# Patient Record
Sex: Male | Born: 1965
Health system: Southern US, Community
[De-identification: ages and names within clinical notes are randomized; demographics above are authoritative.]

## PROBLEM LIST (undated history)

## (undated) DIAGNOSIS — K219 Gastro-esophageal reflux disease without esophagitis: Secondary | ICD-10-CM

## (undated) DIAGNOSIS — R112 Nausea with vomiting, unspecified: Secondary | ICD-10-CM

## (undated) DIAGNOSIS — Z9889 Other specified postprocedural states: Secondary | ICD-10-CM

## (undated) HISTORY — DX: Gastro-esophageal reflux disease without esophagitis: K21.9

---

## 2005-03-12 HISTORY — PX: HAND TENDON SURGERY: SHX663

## 2005-06-25 ENCOUNTER — Ambulatory Visit: Payer: Self-pay | Admitting: Cardiology

## 2005-06-26 ENCOUNTER — Ambulatory Visit: Payer: Self-pay | Admitting: Cardiology

## 2005-07-10 ENCOUNTER — Ambulatory Visit: Payer: Self-pay | Admitting: Cardiology

## 2005-09-27 ENCOUNTER — Ambulatory Visit (HOSPITAL_COMMUNITY): Admission: RE | Admit: 2005-09-27 | Discharge: 2005-09-28 | Payer: Self-pay | Admitting: Orthopedic Surgery

## 2013-02-18 ENCOUNTER — Encounter: Payer: Self-pay | Admitting: Family Medicine

## 2013-02-18 ENCOUNTER — Encounter (INDEPENDENT_AMBULATORY_CARE_PROVIDER_SITE_OTHER): Payer: Self-pay

## 2013-02-18 ENCOUNTER — Ambulatory Visit (INDEPENDENT_AMBULATORY_CARE_PROVIDER_SITE_OTHER): Payer: 59 | Admitting: Family Medicine

## 2013-02-18 VITALS — BP 142/92 | HR 77 | Temp 98.3°F | Ht 67.5 in | Wt 191.6 lb

## 2013-02-18 DIAGNOSIS — Z23 Encounter for immunization: Secondary | ICD-10-CM

## 2013-02-18 DIAGNOSIS — Z0000 Encounter for general adult medical examination without abnormal findings: Secondary | ICD-10-CM

## 2013-02-18 LAB — POCT CBC
Granulocyte percent: 65.1 %G (ref 37–80)
HCT, POC: 51.1 % (ref 43.5–53.7)
Hemoglobin: 16.6 g/dL (ref 14.1–18.1)
Lymph, poc: 3 (ref 0.6–3.4)
MCH, POC: 28.3 pg (ref 27–31.2)
MCHC: 32.5 g/dL (ref 31.8–35.4)
MCV: 87.1 fL (ref 80–97)
MPV: 8.9 fL (ref 0–99.8)
POC Granulocyte: 6.1 (ref 2–6.9)
POC LYMPH PERCENT: 31.5 %L (ref 10–50)
Platelet Count, POC: 328 10*3/uL (ref 142–424)
RBC: 5.9 M/uL (ref 4.69–6.13)
RDW, POC: 13.2 %
WBC: 9.4 10*3/uL (ref 4.6–10.2)

## 2013-02-18 NOTE — Progress Notes (Signed)
   Subjective:    Patient ID: Joseph Merritt, male    DOB: 09-Jun-1965, 47 y.o.   MRN: [id number]  HPI  This 47 y.o. male presents for evaluation of CPE.  He has no acute problems.  Review of Systems No chest pain, SOB, HA, dizziness, vision change, N/V, diarrhea, constipation, dysuria, urinary urgency or frequency, myalgias, arthralgias or rash.     Objective:   Physical Exam Vital signs noted  Well developed well nourished male.  HEENT - Head atraumatic Normocephalic                Eyes - PERRLA, Conjuctiva - clear Sclera- Clear EOMI                Ears - EAC's Wnl TM's Wnl Gross Hearing WNL                Nose - Nares patent                 Throat - oropharanx wnl Respiratory - Lungs CTA bilateral Cardiac - RRR S1 and S2 without murmur GI - Abdomen soft Nontender and bowel sounds active x 4 Extremities - No edema. Neuro - Grossly intact.       Assessment & Plan:  Routine general medical examination at a health care facility - Plan: POCT CBC, CMP14+EGFR, Lipid panel, PSA, total and free, Vit D  25 hydroxy (rtn osteoporosis monitoring)  Need for prophylactic vaccination and inoculation against influenza  Deatra Canter FNP

## 2013-02-19 LAB — VITAMIN D 25 HYDROXY (VIT D DEFICIENCY, FRACTURES): Vit D, 25-Hydroxy: 21.7 ng/mL — ABNORMAL LOW (ref 30.0–100.0)

## 2013-02-19 LAB — LIPID PANEL
Chol/HDL Ratio: 4 ratio units (ref 0.0–5.0)
Cholesterol, Total: 174 mg/dL (ref 100–199)
HDL: 43 mg/dL (ref >39)
LDL Calculated: 113 mg/dL — ABNORMAL HIGH (ref 0–99)
Triglycerides: 89 mg/dL (ref 0–149)
VLDL Cholesterol Cal: 18 mg/dL (ref 5–40)

## 2013-02-19 LAB — PSA, TOTAL AND FREE
PSA, Free Pct: 31.4 %
PSA, Free: 0.22 ng/mL
PSA: 0.7 ng/mL (ref 0.0–4.0)

## 2013-02-19 LAB — CMP14+EGFR
ALT: 35 IU/L (ref 0–44)
AST: 21 IU/L (ref 0–40)
Albumin/Globulin Ratio: 1.8 (ref 1.1–2.5)
Albumin: 4.6 g/dL (ref 3.5–5.5)
Alkaline Phosphatase: 93 IU/L (ref 39–117)
BUN/Creatinine Ratio: 14 (ref 9–20)
BUN: 14 mg/dL (ref 6–24)
CO2: 24 mmol/L (ref 18–29)
Calcium: 9.8 mg/dL (ref 8.7–10.2)
Chloride: 101 mmol/L (ref 97–108)
Creatinine, Ser: 1.01 mg/dL (ref 0.76–1.27)
GFR calc Af Amer: 103 mL/min/{1.73_m2} (ref >59)
GFR calc non Af Amer: 89 mL/min/{1.73_m2} (ref >59)
Globulin, Total: 2.6 g/dL (ref 1.5–4.5)
Glucose: 96 mg/dL (ref 65–99)
Potassium: 5.2 mmol/L (ref 3.5–5.2)
Sodium: 142 mmol/L (ref 134–144)
Total Bilirubin: 0.3 mg/dL (ref 0.0–1.2)
Total Protein: 7.2 g/dL (ref 6.0–8.5)

## 2014-04-12 ENCOUNTER — Encounter: Payer: 59 | Admitting: Family

## 2014-11-11 ENCOUNTER — Ambulatory Visit (INDEPENDENT_AMBULATORY_CARE_PROVIDER_SITE_OTHER): Payer: BLUE CROSS/BLUE SHIELD | Admitting: Family

## 2014-11-11 ENCOUNTER — Encounter: Payer: Self-pay | Admitting: Family

## 2014-11-11 VITALS — BP 136/96 | HR 72 | Temp 98.2°F | Ht 67.5 in | Wt 195.8 lb

## 2014-11-11 DIAGNOSIS — S86812A Strain of other muscle(s) and tendon(s) at lower leg level, left leg, initial encounter: Secondary | ICD-10-CM

## 2014-11-11 DIAGNOSIS — S86912A Strain of unspecified muscle(s) and tendon(s) at lower leg level, left leg, initial encounter: Secondary | ICD-10-CM

## 2014-11-11 DIAGNOSIS — M25562 Pain in left knee: Secondary | ICD-10-CM

## 2014-11-11 MED ORDER — METHYLPREDNISOLONE ACETATE 80 MG/ML IJ SUSP
80.0000 mg | Freq: Once | INTRAMUSCULAR | Status: AC
  Administered 2014-11-11: 80 mg via INTRAMUSCULAR

## 2014-11-11 MED ORDER — KETOROLAC TROMETHAMINE 60 MG/2ML IM SOLN
60.0000 mg | Freq: Once | INTRAMUSCULAR | Status: AC
  Administered 2014-11-11: 60 mg via INTRAMUSCULAR

## 2014-11-11 MED ORDER — MELOXICAM 15 MG PO TABS: 15.0000 mg | ORAL_TABLET | Freq: Every day | ORAL | Status: DC

## 2014-11-11 NOTE — Patient Instructions (Signed)
Lateral Collateral Knee Ligament Sprain with Phase II Rehab The lateral collateral ligament (LCL) of the knee helps hold the knee joint in proper alignment and prevents the bones from shifting out of alignment (displacing) toward the outside (laterally). Injury to the knee may cause a tear in the LCL ligament (sprain). The LCL is the least common ligament of the knee to be injured. Sprains may heal on their own, but they often result in a loose joint. Sprains are classified into three categories. Grade 1 sprains cause pain, but the tendon is not lengthened. Grade 2 sprains include a lengthened ligament, due to the ligament being stretched or partially ruptured. With grade 2 sprains there is still function, although the function may be decreased. Grade 3 sprains involve a complete tear of the tendon or muscle, and function is usually impaired. SYMPTOMS   Pain and tenderness on the outer side of the knee.  A "pop", tearing, or pulling sensation at the time of injury.  Bruising (contusion) at the site of injury within 48 hours of injury.  Knee stiffness.  Limping, often walking with the knee bent. CAUSES  An LCL sprain occurs when a force is placed on the ligament that is greater than it can handle. Common causes of injury include:  Direct hit (trauma) to the inner side of the knee, especially if the foot is planted on the ground.  Forceful pivoting of the body and leg, while the foot is planted on the ground. RISK INCREASES WITH:  Contact sports (football, rugby).  Sports that require pivoting or cutting (soccer).  Poor knee strength and flexibility.  Improper equipment use. PREVENTION   Warm up and stretch properly before activity.  Maintain physical fitness:  Strength, flexibility, and endurance.  Cardiovascular fitness.  Wear properly fitted protective equipment (correct length of cleats for surface).  Functional braces may be effective in preventing injury. PROGNOSIS  If  treated properly, LCL tears usually heal on their own. Sometimes, surgery is required. RELATED COMPLICATIONS   Frequently recurring symptoms, such as knee giving way, instability, and swelling.  Injury to other structures in the knee joint.  Meniscal cartilage, resulting in locking and swelling of the knee.  Articular cartilage, resulting in knee arthritis.  Other ligaments of the knee (commonly).  Injury to nerves, causing numbness of the outer leg, foot, and ankle and weakness or paralysis, with inability to raise the ankle, big toe, or lesser toes.  Knee stiffness (loss of knee motion). TREATMENT Treatment first involves the use of ice and medicine, to reduce pain and inflammation. The use of strengthening and stretching exercises may help reduce pain with activity. These exercises may be performed at home, but referral to a therapist is often advised. You may be advised to walk with crutches, until you are able to walk without a limp. Your caregiver may provide you with a hinged knee brace to help regain a full range of motion, while also protecting the injured knee. For severe LCL injuries, or injuries that involve other ligaments of the knee, surgery is often advised. MEDICATION   If pain medicine is needed, nonsteroidal anti-inflammatory medicines (aspirin and ibuprofen), or other minor pain relievers (acetaminophen), are often advised.  Do not take pain medicine for 7 days before surgery.  Prescription pain relievers may be given, if your caregiver thinks they are needed. Use only as directed and only as much as you need. HEAT AND COLD  Cold treatment (icing) should be applied for 10 to 15 minutes every  2 to 3 hours for inflammation and pain, and immediately after activity that aggravates your symptoms. Use ice packs or an ice massage.  Heat treatment may be used before performing stretching and strengthening activities prescribed by your caregiver, physical therapist, or  athletic trainer. Use a heat pack or a warm water soak. SEEK MEDICAL CARE IF:   Symptoms get worse or do not improve in 4 to 6 weeks, despite treatment.  New, unexplained symptoms develop. (Drugs used in treatment may produce side effects.) EXERCISES  PHASE II EXERCISES  RANGE OF MOTION (ROM) AND STRETCHING EXERCISES - Lateral Collateral Knee Ligament Sprain Phase II After your physician, physical therapist or athletic trainer feels your knee has made progress significant enough to begin more advanced exercises, he or she may recommend some of the exercises that follow. He or she may also advise you to continue with the exercises which you completed in Phase I of your rehabilitation. While completing these exercises, remember:   These exercises are intended to help you restore motion without increasing any swelling.  Restoring tissue flexibility helps normal motion to return to the joints. This allows healthier, less painful movement and activity.  An effective stretch should be held for at least 30 seconds.  A stretch should never be painful. You should only feel a gentle lengthening or release in the stretched tissue. STRETCH - Quadriceps, Prone  Lie on your stomach on a firm surface, such as a bed or padded floor.  Bend your right / left knee and grasp your ankle. If you are unable to reach your ankle or pant leg, use a belt around your foot to lengthen your reach.  Gently pull your heel toward your buttocks. Your knee should not slide out to the side. You should feel a stretch in the front of your thigh and knee.  Hold this position for __________ seconds. Repeat __________ times. Complete this stretch __________ times per day.  STRETCH - Knee Extension, Prone  Lie on your stomach on a firm surface, such as a bed or countertop. Place your right / left knee and leg just beyond the edge of the surface. You may wish to place a towel under the far end of your right / left thigh for  comfort.  Relax your leg muscles and allow gravity to straighten your knee. Your caregiver may advise you to add an ankle weight, if more resistance is helpful for you.  You should feel a stretch in the back of your right / left knee. Hold this position for __________ seconds. Repeat __________ times. Complete this __________ times per day. * Your physician, physical therapist or athletic trainer may ask you to use a __________ ankle weight to enhance your stretch. STRENGTHENING EXERCISES - Lateral Collateral Knee Ligament Sprain Phase II These are some of the exercises you may progress to in your rehabilitation program. Do not begin these until you have your caregiver's permission. Although your condition has improved, the Phase I exercises will continue to be helpful and you may continue to complete them. As you complete strengthening exercises, remember:   Strong muscles with good endurance tolerate stress better.  Do the exercises as initially prescribed by your caregiver. Progress slowly with each exercise, gradually increasing the number of repetitions and weight used under his or her guidance.  You may experience muscle soreness or fatigue, but the pain or discomfort you are trying to eliminate should never worsen during these exercises. If this pain does get worse, stop and  make certain you are following the directions exactly. If the pain is still present after adjustments, discontinue the exercise until you can discuss the trouble with your caregiver. STRENGTH - Hamstring, Curls   Lay on your stomach with your legs extended. (If you lay on a bed, your feet may hang over the edge.)  Tighten the muscles in the back of your thigh to bend your right / left knee up to 90 degrees. Keep your hips flat on the bed.  Hold this position for __________ seconds.  Slowly lower your leg back to the starting position. Repeat __________ times. Complete this exercise __________ times per day.    OPTIONAL ANKLE WEIGHTS: Begin with ____________________, but DO NOT exceed ____________________. Increase in 1 pound/0.5 kilogram increments. STRENGTH - Quadriceps, Short Arcs  Lie on your back. Place a __________ inch towel roll under your right / left knee, so that the knee bends slightly.  Raise only your lower leg by tightening the muscles in the front of your thigh. Do not allow your thigh to rise.  Hold this position for __________ seconds. Repeat __________ times. Complete this exercise __________ times per day.  OPTIONAL ANKLE WEIGHTS: Begin with ____________________, but DO NOT exceed ____________________. Increase in 1 pound/0.5 kilogram increments. STRENGTH - Quadriceps, Step-Ups   Use a thick book, step or step stool that is __________ inches tall.  Hold a wall or counter for balance only, not support.  Slowly step up with your right / left foot, keeping your knee in line with your hip and foot. Do not allow your knee to bend so far that you cannot see your toes.  Slowly unlock your knee and lower yourself to the starting position. Your muscles, not gravity, should lower you. Repeat __________ times. Complete this exercise __________ times per day.  STRENGTH - Quadriceps, Wall Slides  Follow guidelines for form closely. Increased knee pain often results from poorly placed feet or knees.  Lean against a smooth wall or door and walk your feet out 18-24 inches. Place your feet hip width apart.  Slowly slide down the wall or door until your knees bend __________ degrees.* Keep your knees over your heels, not your toes, and in line with your hips, not falling to either side.  Hold for __________ seconds. Stand up to rest for __________ seconds in between each repetition. Repeat __________ times. Complete this exercise __________ times per day. * Your physician, physical therapist or athletic trainer will alter this angle based on your symptoms and progress. Document Released:  06/19/2005 Document Revised: 05/21/2011 Document Reviewed: 06/10/2008 [facility] Patient Information 2015 [place], [place]. This information is not intended to replace advice given to you by your health care provider. Make sure you discuss any questions you have with your health care provider.

## 2014-11-11 NOTE — Addendum Note (Signed)
Addended by: Prescott Gum on: 11/11/2014 04:59 PM   Modules accepted: Kipp Brood

## 2014-11-11 NOTE — Progress Notes (Signed)
Subjective:    Patient ID: Joseph Merritt, male    DOB: [birth year], 49 y.o.   MRN: [id number]  Pt presents to the office today for recurrent left knee pain. Pt states he had similar pain a 5 to 6 months ago, but went away by it's self. Pt states about 3 weeks ago the pain started again and has gradually become worse. PT states he has not taken any medication for it. Knee Pain  The incident occurred more than 1 week ago. There was no injury mechanism. The pain is present in the left knee. The quality of the pain is described as aching. The pain is at a severity of 8/10. The pain is moderate. The pain has been intermittent since onset. Pertinent negatives include no inability to bear weight, numbness or tingling. He reports no foreign bodies present. The symptoms are aggravated by weight bearing. He has tried nothing for the symptoms. The treatment provided no relief.      Review of Systems  Constitutional: Negative.   HENT: Negative.   Respiratory: Negative.   Cardiovascular: Negative.   Gastrointestinal: Negative.   Endocrine: Negative.   Genitourinary: Negative.   Musculoskeletal: Negative.   Neurological: Negative.  Negative for tingling and numbness.  Hematological: Negative.   Psychiatric/Behavioral: Negative.   All other systems reviewed and are negative.      Objective:   Physical Exam  Constitutional: He is oriented to person, place, and time. He appears well-developed and well-nourished. No distress.  HENT:  Head: Normocephalic.  Right Ear: External ear normal.  Left Ear: External ear normal.  Nose: Nose normal.  Mouth/Throat: Oropharynx is clear and moist.  Eyes: Pupils are equal, round, and reactive to light. Right eye exhibits no discharge. Left eye exhibits no discharge.  Neck: Normal range of motion. Neck supple. No thyromegaly present.  Cardiovascular: Normal rate, regular rhythm, normal heart sounds and intact distal pulses.   No murmur heard. Pulmonary/Chest:  Effort normal and breath sounds normal. No respiratory distress. He has no wheezes.  Abdominal: Soft. Bowel sounds are normal. He exhibits no distension. There is no tenderness.  Musculoskeletal: Normal range of motion. He exhibits no edema or tenderness.  Full ROM of left knee, pt states his pain is lateral left calf when moving knee  Neurological: He is alert and oriented to person, place, and time. He has normal reflexes. No cranial nerve deficit.  Skin: Skin is warm and dry. No rash noted. No erythema.  Psychiatric: He has a normal mood and affect. His behavior is normal. Judgment and thought content normal.  Vitals reviewed.   BP 136/96 mmHg  Pulse 72  Temp(Src) 98.2 F (36.8 C) (Oral)  Ht 5' 7.5" (1.715 m)  Wt 195 lb 12.8 oz (88.814 kg)  BMI 30.20 kg/m2       Assessment & Plan:  1. Left knee pain - ketorolac (TORADOL) injection 60 mg; Inject 2 mLs (60 mg total) into the muscle once. - methylPREDNISolone acetate (DEPO-MEDROL) injection 80 mg; Inject 1 mL (80 mg total) into the muscle once. - CMP14+EGFR - Arthritis Panel  2. Knee strain, left, initial encounter -Rest -Ice -Rest -No other NSAID's while taking Mobic -Discussed exercises to help knee -RTO prn- If pain continues may need MRI or Ortho referral - ketorolac (TORADOL) injection 60 mg; Inject 2 mLs (60 mg total) into the muscle once. - methylPREDNISolone acetate (DEPO-MEDROL) injection 80 mg; Inject 1 mL (80 mg total) into the muscle once. - CMP14+EGFR - Arthritis  Panel - meloxicam (MOBIC) 15 MG tablet; Take 1 tablet (15 mg total) by mouth daily.  Dispense: 30 tablet; Refill: 0  Evelina Dun, FNP

## 2014-11-12 LAB — ARTHRITIS PANEL
BASOS ABS: 0 10*3/uL (ref 0.0–0.2)
BASOS: 0 %
EOS (ABSOLUTE): 0.5 10*3/uL — ABNORMAL HIGH (ref 0.0–0.4)
EOS: 5 %
Hematocrit: 47 % (ref 37.5–51.0)
Hemoglobin: 15.4 g/dL (ref 12.6–17.7)
IMMATURE GRANS (ABS): 0 10*3/uL (ref 0.0–0.1)
IMMATURE GRANULOCYTES: 0 %
LYMPHS: 33 %
Lymphocytes Absolute: 3 10*3/uL (ref 0.7–3.1)
MCH: 28.4 pg (ref 26.6–33.0)
MCHC: 32.8 g/dL (ref 31.5–35.7)
MCV: 87 fL (ref 79–97)
MONOCYTES: 7 %
MONOS ABS: 0.6 10*3/uL (ref 0.1–0.9)
NEUTROS PCT: 55 %
Neutrophils Absolute: 4.8 10*3/uL (ref 1.4–7.0)
PLATELETS: 318 10*3/uL (ref 150–379)
RBC: 5.42 x10E6/uL (ref 4.14–5.80)
RDW: 13.7 % (ref 12.3–15.4)
Rhuematoid fact SerPl-aCnc: <10 IU/mL (ref 0.0–13.9)
SED RATE: 2 mm/h (ref 0–15)
Uric Acid: 5.9 mg/dL (ref 3.7–8.6)
WBC: 9 10*3/uL (ref 3.4–10.8)

## 2014-11-12 LAB — CMP14+EGFR
A/G RATIO: 1.6 (ref 1.1–2.5)
ALT: 17 IU/L (ref 0–44)
AST: 20 IU/L (ref 0–40)
Albumin: 4.4 g/dL (ref 3.5–5.5)
Alkaline Phosphatase: 94 IU/L (ref 39–117)
BUN / CREAT RATIO: 13 (ref 9–20)
BUN: 13 mg/dL (ref 6–24)
Bilirubin Total: <0.2 mg/dL (ref 0.0–1.2)
CALCIUM: 9.8 mg/dL (ref 8.7–10.2)
CHLORIDE: 102 mmol/L (ref 97–108)
CO2: 24 mmol/L (ref 18–29)
Creatinine, Ser: 0.99 mg/dL (ref 0.76–1.27)
GFR, EST AFRICAN AMERICAN: 104 mL/min/{1.73_m2} (ref >59)
GFR, EST NON AFRICAN AMERICAN: 90 mL/min/{1.73_m2} (ref >59)
GLUCOSE: 85 mg/dL (ref 65–99)
Globulin, Total: 2.8 g/dL (ref 1.5–4.5)
Potassium: 5.2 mmol/L (ref 3.5–5.2)
Sodium: 143 mmol/L (ref 134–144)
TOTAL PROTEIN: 7.2 g/dL (ref 6.0–8.5)

## 2014-12-08 ENCOUNTER — Ambulatory Visit: Payer: BLUE CROSS/BLUE SHIELD | Admitting: Pediatrics

## 2014-12-09 ENCOUNTER — Ambulatory Visit (INDEPENDENT_AMBULATORY_CARE_PROVIDER_SITE_OTHER): Payer: BLUE CROSS/BLUE SHIELD | Admitting: Pediatrics

## 2014-12-09 ENCOUNTER — Ambulatory Visit (INDEPENDENT_AMBULATORY_CARE_PROVIDER_SITE_OTHER): Payer: BLUE CROSS/BLUE SHIELD

## 2014-12-09 ENCOUNTER — Encounter: Payer: Self-pay | Admitting: Pediatrics

## 2014-12-09 VITALS — BP 141/97 | HR 76 | Temp 97.5°F | Ht 67.5 in | Wt 195.4 lb

## 2014-12-09 DIAGNOSIS — S86812A Strain of other muscle(s) and tendon(s) at lower leg level, left leg, initial encounter: Secondary | ICD-10-CM

## 2014-12-09 DIAGNOSIS — M25562 Pain in left knee: Secondary | ICD-10-CM | POA: Diagnosis not present

## 2014-12-09 DIAGNOSIS — R030 Elevated blood-pressure reading, without diagnosis of hypertension: Secondary | ICD-10-CM

## 2014-12-09 DIAGNOSIS — S86912A Strain of unspecified muscle(s) and tendon(s) at lower leg level, left leg, initial encounter: Secondary | ICD-10-CM

## 2014-12-09 DIAGNOSIS — IMO0001 Reserved for inherently not codable concepts without codable children: Secondary | ICD-10-CM

## 2014-12-09 MED ORDER — MELOXICAM 15 MG PO TABS: 15.0000 mg | ORAL_TABLET | Freq: Every day | ORAL | Status: DC

## 2014-12-09 NOTE — Progress Notes (Signed)
Subjective:    Patient ID: Joseph Merritt, male    DOB: 1965-10-24, 49 y.o.   MRN: [id number]  HPI: Joseph Merritt is a 49 y.o. male presenting on 12/09/2014 for Knee Pain  Seen 4 weeks ago for knee pain, had an IM steroid injection, improved the pain for 1 week. Feels the knee pain when sitting not doing anything, sometimes pain goes up his thigh. Walking is when pain is the worst.  Hurts with full extension and about 90 degrees of flexion starts feeling tight over quad.  No injuries. No regular exercise now. Now working at [employer], walks a lot, uneven surfaces.  Pain is better in the morning, once he starts walking it hurts more. Taking mobic most days, always at night, doesn't notice a difference when he takes it vs not taking it.  No redness. Can point to where it hurts the most, medial knee above joint line.  Relevant past medical, surgical, family and social history reviewed and updated as indicated. Interim medical history since our last visit reviewed. Allergies and medications reviewed and updated.  ROS: Per HPI unless specifically indicated above  Past Medical History There are no active problems to display for this patient.   Current Outpatient Prescriptions  Medication Sig Dispense Refill  . meloxicam (MOBIC) 15 MG tablet Take 1 tablet (15 mg total) by mouth daily. 30 tablet 2   No current facility-administered medications for this visit.       Objective:    BP 141/97 mmHg  Pulse 76  Temp(Src) 97.5 F (36.4 C) (Oral)  Ht 5' 7.5" (1.715 m)  Wt 195 lb 6.4 oz (88.633 kg)  BMI 30.13 kg/m2  Wt Readings from Last 3 Encounters:  12/09/14 195 lb 6.4 oz (88.633 kg)  11/11/14 195 lb 12.8 oz (88.814 kg)  02/18/13 191 lb 9.6 oz (86.909 kg)    Gen: NAD, alert, cooperative with exam, NCAT EYES: EOMI, no scleral injection or icterus CV: WWP, distal pulses intact Resp:  normal WOB Neuro: Alert and oriented, sensation intact LE b/l MSK:  Knee exam: L  knee slightly swollen compared to R, no redness or heat, small amount of effusion, no obvious bony abnormalities. TTP medial femoral epicondyle and some along medial joint line.  No patellar tenderness. ROM full in flexion and extension and lower leg rotation on R, on L pain with last 10 degrees of extension, and pain limits flexion to 90 degrees.. Ligaments with solid consistent endpoints including ACL, PCL, LCL, MCL. Negative Mcmurray's Non painful patellar compression. Patellar glide without crepitus. Patellar and quadriceps tendons unremarkable. Hamstring and quadriceps strength is normal and equal R and L.  Xray: joint space normal, no OA, no fractures. I personally reviewed this image.    Assessment & Plan:   Joseph Merritt was seen today for knee pain, pt has a small effusion, Xray with no OA, exam with intact ligaments. Pt going to take meloxicam in the morning, knee brace the last two days has helped some, ice, rest. RTC if not improved, can refer for PT vs sports medicine.   Diagnoses and all orders for this visit:  Knee pain, acute, left -     DG Knee 1-2 Views Left; Future  Knee strain, left, initial encounter -     meloxicam (MOBIC) 15 MG tablet; Take 1 tablet (15 mg total) by mouth daily.  Elevated blood pressure Pt to recheck at home and bring numbers back to clinic, has f/u appt already  scheduled for CPE.   Follow up plan: As scheduled, sooner if needed  Rex Kras, MD [facility] 12/09/2014, 2:38 PM

## 2014-12-27 ENCOUNTER — Encounter (INDEPENDENT_AMBULATORY_CARE_PROVIDER_SITE_OTHER): Payer: Self-pay

## 2014-12-27 ENCOUNTER — Ambulatory Visit (INDEPENDENT_AMBULATORY_CARE_PROVIDER_SITE_OTHER): Payer: BLUE CROSS/BLUE SHIELD | Admitting: Family Medicine

## 2014-12-27 ENCOUNTER — Encounter: Payer: Self-pay | Admitting: Family Medicine

## 2014-12-27 VITALS — BP 154/105 | HR 68 | Temp 97.9°F | Ht 67.5 in | Wt 194.4 lb

## 2014-12-27 DIAGNOSIS — Z0000 Encounter for general adult medical examination without abnormal findings: Secondary | ICD-10-CM

## 2014-12-27 DIAGNOSIS — I10 Essential (primary) hypertension: Secondary | ICD-10-CM | POA: Insufficient documentation

## 2014-12-27 DIAGNOSIS — Z1211 Encounter for screening for malignant neoplasm of colon: Secondary | ICD-10-CM | POA: Insufficient documentation

## 2014-12-27 DIAGNOSIS — G8929 Other chronic pain: Secondary | ICD-10-CM | POA: Insufficient documentation

## 2014-12-27 DIAGNOSIS — Z23 Encounter for immunization: Secondary | ICD-10-CM

## 2014-12-27 DIAGNOSIS — M25562 Pain in left knee: Secondary | ICD-10-CM | POA: Insufficient documentation

## 2014-12-27 MED ORDER — HYDROCHLOROTHIAZIDE 12.5 MG PO CAPS: 12.5000 mg | ORAL_CAPSULE | Freq: Every day | ORAL | Status: DC

## 2014-12-27 NOTE — Progress Notes (Signed)
   HPI  Patient presents today for an annual physical.  Left knee pain. He states he's had one and a half months of left knee pain described as medial dull aching knee pain worse after a long day's work. He does have locking and popping symptoms He denies any injury He has some mild swelling and increased pain after work. No fevers, chills, sweats.  Elevated blood pressure States that his blood pressures been high or borderline high for his lungs he can remember. He is okay with starting medications today No chest pain, dyspnea, palpitations, leg edema.  No regular exercise regimen, however he does work at [employer] and does very physical work  PMH: Smoking status noted Past medical, surgical, social, and family history reviewed and updated in EMR ROS: Per HPI  Objective: BP 154/105 mmHg  Pulse 68  Temp(Src) 97.9 F (36.6 C) (Oral)  Ht 5' 7.5" (1.715 m)  Wt 194 lb 6.4 oz (88.179 kg)  BMI 29.98 kg/m2 Gen: NAD, alert, cooperative with exam HEENT: NCAT, sclera white CV: RRR, good S1/S2, no murmur Resp: CTABL, no wheezes, non-labored Ext: No edema, warm Neuro: Alert and oriented, No gross deficits MSK: L knee without effusion Slight medial joint line tenderness ligamentously intact to Lachman's and with varus and valgus stress.  Negative McMurray's test   Assessment and plan:  # annual physical Fasting labs Flu shot  # HTN New Dx Start 12.5 mg HCTZ Last 4 visits with diastolic >90  # L knee pain Likely meniscal injury Discussed continued conservative therapy, steroid injections and possibility of joint aspiration for crystal analysis, also discussed possibility of benefit of knee arthroscopy/referral to orthopedics with walking popping symptoms.   Meds ordered this encounter  Medications  . hydrochlorothiazide (MICROZIDE) 12.5 MG capsule    Sig: Take 1 capsule (12.5 mg total) by mouth daily.    Dispense:  30 capsule    Refill:  2    Murtis SinkSam Bradshaw,  MD [facility] 12/27/2014, 10:17 AM

## 2014-12-27 NOTE — Patient Instructions (Signed)
Great to meet you!  For your knee Wear compression sleeve while working or exercising Ice after work Mobic is ok, Armond Hangaleve is ok as well but do not mix them.   We have started HCTZ for your blood pressure, please come back in 6-8 weeks for repeat labs and to re-check your blood pressure.

## 2014-12-28 LAB — CMP14+EGFR
A/G RATIO: 1.8 (ref 1.1–2.5)
ALT: 24 IU/L (ref 0–44)
AST: 27 IU/L (ref 0–40)
Albumin: 4.5 g/dL (ref 3.5–5.5)
Alkaline Phosphatase: 94 IU/L (ref 39–117)
BILIRUBIN TOTAL: 0.4 mg/dL (ref 0.0–1.2)
BUN/Creatinine Ratio: 16 (ref 9–20)
BUN: 14 mg/dL (ref 6–24)
CHLORIDE: 101 mmol/L (ref 97–106)
CO2: 26 mmol/L (ref 18–29)
Calcium: 9.8 mg/dL (ref 8.7–10.2)
Creatinine, Ser: 0.86 mg/dL (ref 0.76–1.27)
GFR calc Af Amer: 119 mL/min/{1.73_m2} (ref >59)
GFR calc non Af Amer: 103 mL/min/{1.73_m2} (ref >59)
GLUCOSE: 82 mg/dL (ref 65–99)
Globulin, Total: 2.5 g/dL (ref 1.5–4.5)
POTASSIUM: 5.5 mmol/L — AB (ref 3.5–5.2)
Sodium: 140 mmol/L (ref 136–144)
TOTAL PROTEIN: 7 g/dL (ref 6.0–8.5)

## 2014-12-28 LAB — LIPID PANEL
CHOL/HDL RATIO: 3.2 ratio (ref 0.0–5.0)
CHOLESTEROL TOTAL: 139 mg/dL (ref 100–199)
HDL: 43 mg/dL (ref >39)
LDL CALC: 79 mg/dL (ref 0–99)
TRIGLYCERIDES: 85 mg/dL (ref 0–149)
VLDL CHOLESTEROL CAL: 17 mg/dL (ref 5–40)

## 2014-12-28 LAB — CBC
HEMATOCRIT: 46.9 % (ref 37.5–51.0)
HEMOGLOBIN: 15.7 g/dL (ref 12.6–17.7)
MCH: 28.6 pg (ref 26.6–33.0)
MCHC: 33.5 g/dL (ref 31.5–35.7)
MCV: 85 fL (ref 79–97)
Platelets: 343 10*3/uL (ref 150–379)
RBC: 5.49 x10E6/uL (ref 4.14–5.80)
RDW: 14.2 % (ref 12.3–15.4)
WBC: 9.3 10*3/uL (ref 3.4–10.8)

## 2015-01-28 ENCOUNTER — Encounter: Payer: Self-pay | Admitting: Family Medicine

## 2015-01-28 ENCOUNTER — Ambulatory Visit (INDEPENDENT_AMBULATORY_CARE_PROVIDER_SITE_OTHER): Payer: BLUE CROSS/BLUE SHIELD | Admitting: Family Medicine

## 2015-01-28 VITALS — BP 153/96 | HR 76 | Temp 97.2°F | Ht 67.5 in | Wt 195.4 lb

## 2015-01-28 DIAGNOSIS — I10 Essential (primary) hypertension: Secondary | ICD-10-CM

## 2015-01-28 DIAGNOSIS — E875 Hyperkalemia: Secondary | ICD-10-CM | POA: Insufficient documentation

## 2015-01-28 DIAGNOSIS — M25562 Pain in left knee: Secondary | ICD-10-CM | POA: Diagnosis not present

## 2015-01-28 DIAGNOSIS — K047 Periapical abscess without sinus: Secondary | ICD-10-CM | POA: Diagnosis not present

## 2015-01-28 MED ORDER — HYDROCHLOROTHIAZIDE 25 MG PO TABS: 25.0000 mg | ORAL_TABLET | Freq: Every day | ORAL | Status: DC

## 2015-01-28 NOTE — Progress Notes (Signed)
   HPI  Patient presents today here to follow-up for hypertension and left knee pain.  Hypertension Doing better, blood pressure at home ranging 120-140 over high 80s. He is noticed diuresis, but having no side effects from HCTZ. No chest pain, leg edema, dyspnea. He does have occasional palpitations with no other symptoms.  Left knee pain Improving, still occasionally has sharp pain while standing at work. Considering cortisone shot  Tooth abscess. Notes left upper tooth that has some creamy discharge now for over. He states that he is talking his dentist about and he states he needs to get pulled, he has been on antibodies for this. , However that was several months ago. He is asking if he should get it addressed by his dentist. He has no facial pain, swelling, or signs of illness at this time.  PMH: Smoking status noted ROS: Per HPI  Objective: BP 153/96 mmHg  Pulse 76  Temp(Src) 97.2 F (36.2 C) (Oral)  Ht 5' 7.5" (1.715 m)  Wt 195 lb 6.4 oz (88.633 kg)  BMI 30.13 kg/m2 Gen: NAD, alert, cooperative with exam HEENT: NCAT CV: RRR, good S1/S2, no murmur Resp: CTABL, no wheezes, non-labored Ext: No edema, warm Neuro: Alert and oriented, No gross deficits  Assessment and plan:  # Hypertension Improving, still not quite controlled Increase HCTZ 25 mg daily Previously had hyperkalemia, repeat BMP today   # Knee pain Discussed utility of knee injection today, with him improving I recommended watching and waiting. Still offered orthopedic referral if needed.  # Possible abscess tooth Recommended addressing with the dentist, he has no signs of active infection currently, he will call me if he has any symptoms.   Orders Placed This Encounter  Procedures  . BMP8+EGFR    Meds ordered this encounter  Medications  . hydrochlorothiazide (HYDRODIURIL) 25 MG tablet    Sig: Take 1 tablet (25 mg total) by mouth daily.    Dispense:  90 tablet    Refill:  [place], MD [place] 01/28/2015, 10:07 AM

## 2015-01-28 NOTE — Patient Instructions (Signed)
Great to see you!   I increased your medicine, just take 2 pills dialy until you get a refill.   Come back in 2 months

## 2015-01-29 LAB — BMP8+EGFR
BUN / CREAT RATIO: 13 (ref 9–20)
BUN: 12 mg/dL (ref 6–24)
CO2: 26 mmol/L (ref 18–29)
CREATININE: 0.89 mg/dL (ref 0.76–1.27)
Calcium: 9.4 mg/dL (ref 8.7–10.2)
Chloride: 99 mmol/L (ref 97–106)
GFR calc non Af Amer: 101 mL/min/{1.73_m2} (ref >59)
GFR, EST AFRICAN AMERICAN: 117 mL/min/{1.73_m2} (ref >59)
Glucose: 121 mg/dL — ABNORMAL HIGH (ref 65–99)
Potassium: 5.3 mmol/L — ABNORMAL HIGH (ref 3.5–5.2)
Sodium: 140 mmol/L (ref 136–144)

## 2015-01-31 NOTE — Progress Notes (Signed)
Patient aware.

## 2015-02-15 ENCOUNTER — Other Ambulatory Visit: Payer: Self-pay

## 2015-02-15 DIAGNOSIS — S86912A Strain of unspecified muscle(s) and tendon(s) at lower leg level, left leg, initial encounter: Secondary | ICD-10-CM

## 2015-02-15 MED ORDER — MELOXICAM 15 MG PO TABS: 15.0000 mg | ORAL_TABLET | Freq: Every day | ORAL | Status: DC

## 2015-03-31 ENCOUNTER — Ambulatory Visit (INDEPENDENT_AMBULATORY_CARE_PROVIDER_SITE_OTHER): Payer: BLUE CROSS/BLUE SHIELD | Admitting: Family Medicine

## 2015-03-31 ENCOUNTER — Encounter: Payer: Self-pay | Admitting: Family Medicine

## 2015-03-31 VITALS — BP 130/84 | HR 75 | Temp 97.9°F | Ht 67.5 in | Wt 194.0 lb

## 2015-03-31 DIAGNOSIS — L570 Actinic keratosis: Secondary | ICD-10-CM

## 2015-03-31 DIAGNOSIS — R0982 Postnasal drip: Secondary | ICD-10-CM | POA: Diagnosis not present

## 2015-03-31 DIAGNOSIS — I10 Essential (primary) hypertension: Secondary | ICD-10-CM | POA: Diagnosis not present

## 2015-03-31 DIAGNOSIS — E875 Hyperkalemia: Secondary | ICD-10-CM

## 2015-03-31 DIAGNOSIS — J029 Acute pharyngitis, unspecified: Secondary | ICD-10-CM | POA: Diagnosis not present

## 2015-03-31 LAB — POCT RAPID STREP A (OFFICE): RAPID STREP A SCREEN: NEGATIVE

## 2015-03-31 MED ORDER — MOMETASONE FUROATE 50 MCG/ACT NA SUSP: 2.0000 | Freq: Every day | NASAL | Status: DC

## 2015-03-31 NOTE — Patient Instructions (Signed)
Great to see you!  Come back in 4 months unless you need [place] sooner  We will call with labs within 1 week  Try nasonex for you sore throat and hoarseness (I think its post nasal drip, so this will dry up your nose)

## 2015-03-31 NOTE — Progress Notes (Signed)
HPI  Patient presents today today for hypertension recheck, skin lesion, and sore throat.  Sore throat For about 2 weeks, with   frequent throat clearing No cough, shortness of breath, or malaise.  Hypertension Good medication compliance No chest pain, dyspnea, palpitations, leg edema.  Skin lesion 6+ months of history of repetitive skin lesion on his right temporal area, he picks at it, it peels off, and then grows back. He would like examined today No history of skin cancer  PMH: Smoking status noted ROS: Per HPI  Objective: BP 130/84 mmHg  Pulse 75  Temp(Src) 97.9 F (36.6 C) (Oral)  Ht 5' 7.5" (1.715 m)  Wt 194 lb (87.998 kg)  BMI 29.92 kg/m2 Gen: NAD, alert, cooperative with exam HEENT: NCAT,  nares with mucosal swelling, oropharynx clear, TMs normal CV: RRR, good S1/S2, no murmur Resp: CTABL, no wheezes, non-labored Ext: No edema, warm Neuro: Alert and oriented, No gross deficits  Skin: Hyperkeratotic lesion approximately 1-2 mm in diameter on his right temporal area, no erythema or tenderness to palpation   Assessment and plan:  # Actinic keratosis Treated with cryotherapy today Discussed usual course of illness  # Hypertension Well-controlled Continue HCTZ  # Hyperkalemia Stable last check Recheck today  # post nasal drip Nasonex     Orders Placed This Encounter  Procedures  . Culture, Group A Strep    Order Specific Question:  Source    Answer:  throat  . BMP8+EGFR  . POCT rapid strep A    Meds ordered this encounter  Medications  . mometasone (NASONEX) 50 MCG/ACT nasal spray    Sig: Place 2 sprays into the nose daily.    Dispense:  17 g    Refill:  [place], MD Jansen 03/31/2015, 10:00 AM

## 2015-04-01 LAB — BMP8+EGFR
BUN/Creatinine Ratio: 15 (ref 9–20)
BUN: 14 mg/dL (ref 6–24)
CALCIUM: 9.4 mg/dL (ref 8.7–10.2)
CO2: 25 mmol/L (ref 18–29)
CREATININE: 0.91 mg/dL (ref 0.76–1.27)
Chloride: 98 mmol/L (ref 96–106)
GFR calc Af Amer: 115 mL/min/{1.73_m2} (ref >59)
GFR, EST NON AFRICAN AMERICAN: 99 mL/min/{1.73_m2} (ref >59)
GLUCOSE: 118 mg/dL — AB (ref 65–99)
Potassium: 4 mmol/L (ref 3.5–5.2)
SODIUM: 138 mmol/L (ref 134–144)

## 2015-04-03 LAB — CULTURE, GROUP A STREP: Strep A Culture: NEGATIVE

## 2015-04-19 ENCOUNTER — Other Ambulatory Visit: Payer: Self-pay

## 2015-04-19 MED ORDER — HYDROCHLOROTHIAZIDE 25 MG PO TABS: 25.0000 mg | ORAL_TABLET | Freq: Every day | ORAL | Status: DC

## 2015-04-21 ENCOUNTER — Encounter: Payer: Self-pay | Admitting: *Deleted

## 2015-07-29 ENCOUNTER — Ambulatory Visit: Payer: BLUE CROSS/BLUE SHIELD | Admitting: Family Medicine

## 2015-08-01 ENCOUNTER — Ambulatory Visit: Payer: BLUE CROSS/BLUE SHIELD | Admitting: Family Medicine

## 2015-08-03 ENCOUNTER — Encounter (INDEPENDENT_AMBULATORY_CARE_PROVIDER_SITE_OTHER): Payer: Self-pay

## 2015-08-04 ENCOUNTER — Encounter: Payer: Self-pay | Admitting: Family Medicine

## 2015-08-04 ENCOUNTER — Ambulatory Visit (INDEPENDENT_AMBULATORY_CARE_PROVIDER_SITE_OTHER): Payer: BLUE CROSS/BLUE SHIELD | Admitting: Family Medicine

## 2015-08-04 VITALS — BP 138/91 | HR 76 | Temp 97.4°F | Ht 67.5 in | Wt 192.4 lb

## 2015-08-04 DIAGNOSIS — I10 Essential (primary) hypertension: Secondary | ICD-10-CM | POA: Diagnosis not present

## 2015-08-04 DIAGNOSIS — E875 Hyperkalemia: Secondary | ICD-10-CM | POA: Diagnosis not present

## 2015-08-04 DIAGNOSIS — M25562 Pain in left knee: Secondary | ICD-10-CM

## 2015-08-04 NOTE — Progress Notes (Signed)
   HPI  Patient presents today here to follow-up for hypertension knee pain.  Hypertension Has several days a week where he is forgetting doses, maybe 3 or 4 days a week he forgets to take HCTZ He denies any headache, chest pain, shortness of breath. He checks blood pressure every once in a while at a local store, systolic is generally 120 or 130 over low 90s He does have noticeable diuresis with HCTZ.  Knee pain Described as annoying type knee pain Usually does not stop him. It has improved since the initial visit. He has no more improvement meloxicam and Tylenol, I recommended continuing Tylenol.  He also processed foods, however he does not add much salt to his foods  PMH: Smoking status noted ROS: Per HPI  Objective: BP 138/91 mmHg  Pulse 76  Temp(Src) 97.4 F (36.3 C) (Oral)  Ht 5' 7.5" (1.715 m)  Wt 192 lb 6.4 oz (87.272 kg)  BMI 29.67 kg/m2 Gen: NAD, alert, cooperative with exam HEENT: NCAT CV: RRR, good S1/S2, no murmur Resp: CTABL, no wheezes, non-labored Ext: No edema, warm Neuro: Alert and oriented, No gross deficits  Assessment and plan:  # Hypertension Borderline today and diastolic blood pressure Continue HCTZ, improved compliance, decreased salt intake Continue therapeutic lifestyle changes, discussed exercise especially pertaining to his knee pain.  # Knee pain Encouraged low-impact exercise to strengthen quadricep muscles like on a stationary bike Return to clinic with any worsening, otherwise follow up as needed   # Hyperkalemia Last visit was resolved, Plan on checking with next set of labs, 5-6 months is okay  Avoiding ACE inhibitor's for now    Murtis SinkSam Bradshaw, MD [facility] 08/04/2015, 3:06 PM

## 2015-10-03 IMAGING — CR DG KNEE 1-2V*L*
2 series · 2 of 2 positions shown · non-contrast
Comparison: None.

CLINICAL DATA: Acute left knee pain.

EXAM:
LEFT KNEE - 1-2 VIEW

[view not recorded (1 of 2)]
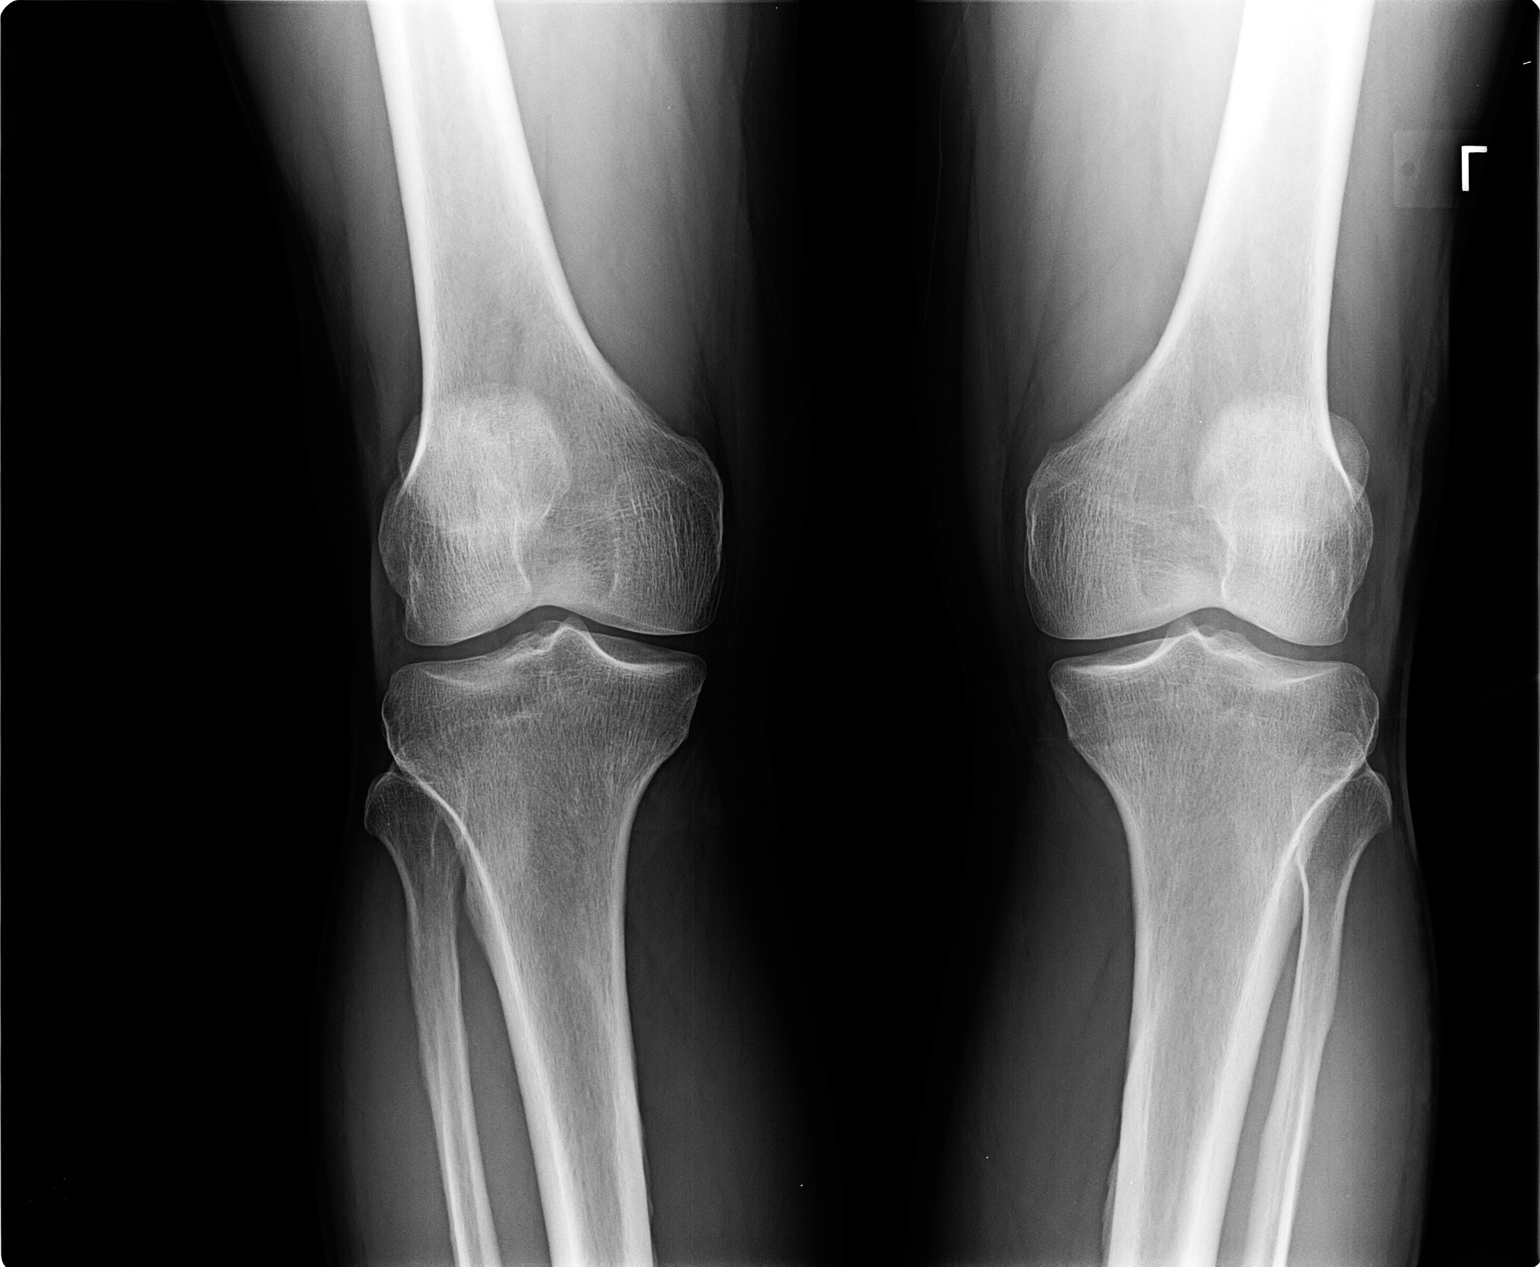

[view not recorded (2 of 2)]
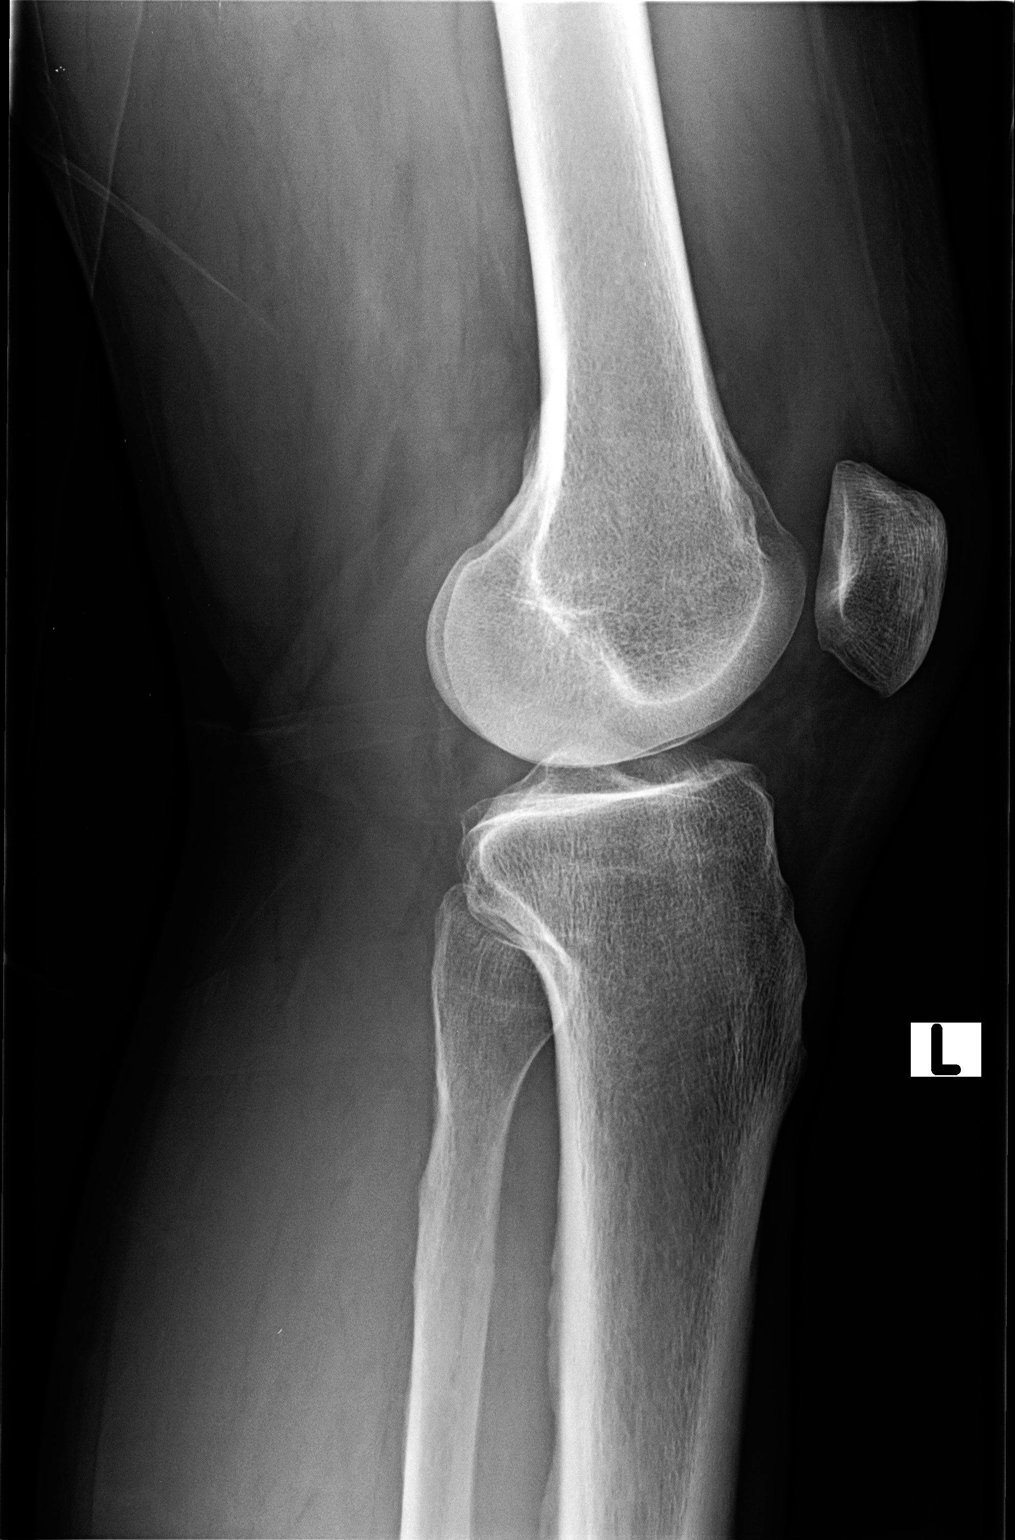

[2 of 2 positions shown; findings below may reference images not displayed]

FINDINGS: There is a joint effusion.  Osseous structures appear normal.
IMPRESSION: Joint effusion.

## 2016-02-16 ENCOUNTER — Other Ambulatory Visit: Payer: Self-pay | Admitting: Family Medicine

## 2016-05-27 ENCOUNTER — Other Ambulatory Visit: Payer: Self-pay | Admitting: Family Medicine

## 2016-06-11 ENCOUNTER — Ambulatory Visit (INDEPENDENT_AMBULATORY_CARE_PROVIDER_SITE_OTHER): Payer: BLUE CROSS/BLUE SHIELD | Admitting: Family Medicine

## 2016-06-11 ENCOUNTER — Encounter: Payer: Self-pay | Admitting: Family Medicine

## 2016-06-11 VITALS — BP 120/83 | HR 75 | Temp 98.3°F | Ht 67.5 in | Wt 196.8 lb

## 2016-06-11 DIAGNOSIS — Z0000 Encounter for general adult medical examination without abnormal findings: Secondary | ICD-10-CM

## 2016-06-11 DIAGNOSIS — J200 Acute bronchitis due to Mycoplasma pneumoniae: Secondary | ICD-10-CM

## 2016-06-11 LAB — BAYER DCA HB A1C WAIVED: HB A1C: 5.9 % (ref <7.0)

## 2016-06-11 MED ORDER — AZITHROMYCIN 250 MG PO TABS: ORAL_TABLET | ORAL | 0 refills | Status: DC

## 2016-06-11 NOTE — Progress Notes (Signed)
   HPI  Patient presents today for annual physical, also for cough.  Patient feels well.  He has no concerns except for the cough.  He is not exercising or watching his diet regularly.  He denies any shortness of breath,  chills, sweats. Reports 2 weeks of cough, productive off and on, off and on subjective fever. Patient states he got worse after going on a chair to [place] where he had lots of sightseeing. No medications for allergies. Has been taking Mucinex as well as DayQuil   PMH: Smoking status noted ROS: Per HPI  Objective: BP 120/83   Pulse 75   Temp 98.3 F (36.8 C) (Oral)   Ht 5' 7.5" (1.715 m)   Wt 196 lb 12.8 oz (89.3 kg)   BMI 30.37 kg/m  Gen: NAD, alert, cooperative with exam HEENT: NCAT, oropharynx clear, nares clear, TMs normal bilaterally, EOMI, PERRLA CV: RRR, good S1/S2, no murmur Resp: CTABL, no wheezes, non-labored Abd: SNTND, BS present, no guarding or organomegaly Ext: No edema, warm Neuro: Alert and oriented, 1+ symmetric PT reflexes, strength 5/5 and sensation intact in bilateral lower extremities  Assessment and plan:  # Annual physical exam Normal exam except for weight, discussed therapeutic lifestyle changes Basic labs including PSA and A1c. Patient's glucose was118 last year.  # Atypical pneumonia, acute bronchitis Treat as walking pneumonia with azithromycin, waxing and waning cough with off and on fever for 2 weeks. Continue over-the-counter cough medication Return to clinic if not improving as expected   Orders Placed This Encounter  Procedures  . CMP14+EGFR  . CBC with Differential/Platelet  . Lipid panel  . TSH  . PSA  . Bayer DCA Hb A1c Waived    Meds ordered this encounter  Medications  . azithromycin (ZITHROMAX) 250 MG tablet    Sig: Take 2 tablets on day 1 and 1 tablet daily after that    Dispense:  6 tablet    Refill:  0    Laroy Apple, MD [facility] 06/11/2016, 11:54 AM

## 2016-06-11 NOTE — Patient Instructions (Signed)
Great to see you!  Try the azithro for your cough, continue the cough medicine you are taking.   Start a plain zyrtec daily for 1-2 weeks as well.

## 2016-06-12 LAB — CBC WITH DIFFERENTIAL/PLATELET
BASOS ABS: 0 10*3/uL (ref 0.0–0.2)
BASOS: 0 %
EOS (ABSOLUTE): 0.5 10*3/uL — AB (ref 0.0–0.4)
Eos: 6 %
Hematocrit: 45.4 % (ref 37.5–51.0)
Hemoglobin: 15.9 g/dL (ref 13.0–17.7)
Immature Grans (Abs): 0 10*3/uL (ref 0.0–0.1)
Immature Granulocytes: 0 %
LYMPHS ABS: 2.7 10*3/uL (ref 0.7–3.1)
LYMPHS: 37 %
MCH: 28.5 pg (ref 26.6–33.0)
MCHC: 35 g/dL (ref 31.5–35.7)
MCV: 82 fL (ref 79–97)
MONOS ABS: 0.9 10*3/uL (ref 0.1–0.9)
Monocytes: 13 %
Neutrophils Absolute: 3.3 10*3/uL (ref 1.4–7.0)
Neutrophils: 44 %
PLATELETS: 314 10*3/uL (ref 150–379)
RBC: 5.57 x10E6/uL (ref 4.14–5.80)
RDW: 14.4 % (ref 12.3–15.4)
WBC: 7.4 10*3/uL (ref 3.4–10.8)

## 2016-06-12 LAB — PSA: Prostate Specific Ag, Serum: 0.5 ng/mL (ref 0.0–4.0)

## 2016-06-12 LAB — CMP14+EGFR
A/G RATIO: 1.4 (ref 1.2–2.2)
ALT: 19 IU/L (ref 0–44)
AST: 24 IU/L (ref 0–40)
Albumin: 4.3 g/dL (ref 3.5–5.5)
Alkaline Phosphatase: 101 IU/L (ref 39–117)
BILIRUBIN TOTAL: 0.2 mg/dL (ref 0.0–1.2)
BUN/Creatinine Ratio: 13 (ref 9–20)
BUN: 12 mg/dL (ref 6–24)
CHLORIDE: 100 mmol/L (ref 96–106)
CO2: 26 mmol/L (ref 18–29)
Calcium: 9.3 mg/dL (ref 8.7–10.2)
Creatinine, Ser: 0.94 mg/dL (ref 0.76–1.27)
GFR calc Af Amer: 110 mL/min/{1.73_m2} (ref >59)
GFR calc non Af Amer: 95 mL/min/{1.73_m2} (ref >59)
GLOBULIN, TOTAL: 3 g/dL (ref 1.5–4.5)
Glucose: 89 mg/dL (ref 65–99)
POTASSIUM: 5 mmol/L (ref 3.5–5.2)
SODIUM: 142 mmol/L (ref 134–144)
TOTAL PROTEIN: 7.3 g/dL (ref 6.0–8.5)

## 2016-06-12 LAB — TSH: TSH: 0.746 u[IU]/mL (ref 0.450–4.500)

## 2016-06-12 LAB — LIPID PANEL
CHOL/HDL RATIO: 4.5 ratio (ref 0.0–5.0)
Cholesterol, Total: 141 mg/dL (ref 100–199)
HDL: 31 mg/dL — ABNORMAL LOW (ref >39)
LDL CALC: 95 mg/dL (ref 0–99)
Triglycerides: 77 mg/dL (ref 0–149)
VLDL Cholesterol Cal: 15 mg/dL (ref 5–40)

## 2017-03-03 ENCOUNTER — Emergency Department (HOSPITAL_COMMUNITY)
Admission: EM | Admit: 2017-03-03 | Discharge: 2017-03-03 | Disposition: A | Discharge status: Home / Self Care | Payer: Worker's Compensation | Attending: Emergency Medicine | Admitting: Emergency Medicine

## 2017-03-03 ENCOUNTER — Emergency Department (HOSPITAL_COMMUNITY): Payer: Worker's Compensation

## 2017-03-03 ENCOUNTER — Encounter (HOSPITAL_COMMUNITY): Payer: Self-pay

## 2017-03-03 DIAGNOSIS — S59902A Unspecified injury of left elbow, initial encounter: Secondary | ICD-10-CM | POA: Diagnosis present

## 2017-03-03 DIAGNOSIS — S46219A Strain of muscle, fascia and tendon of other parts of biceps, unspecified arm, initial encounter: Secondary | ICD-10-CM

## 2017-03-03 DIAGNOSIS — S46222A Laceration of muscle, fascia and tendon of other parts of biceps, left arm, initial encounter: Secondary | ICD-10-CM | POA: Diagnosis not present

## 2017-03-03 DIAGNOSIS — Y929 Unspecified place or not applicable: Secondary | ICD-10-CM | POA: Diagnosis not present

## 2017-03-03 DIAGNOSIS — Y990 Civilian activity done for income or pay: Secondary | ICD-10-CM | POA: Diagnosis not present

## 2017-03-03 DIAGNOSIS — X58XXXA Exposure to other specified factors, initial encounter: Secondary | ICD-10-CM | POA: Insufficient documentation

## 2017-03-03 DIAGNOSIS — Y939 Activity, unspecified: Secondary | ICD-10-CM | POA: Insufficient documentation

## 2017-03-03 NOTE — ED Triage Notes (Signed)
Pt reports was working with a machine at work today and it caused him to abruptly extend his left arm.  C/O pain to left arm between elbow and bicep.

## 2017-03-03 NOTE — ED Provider Notes (Signed)
[facility] Provider Note   CSN: [id number] Arrival date & time: 03/03/17  1445     History   Chief Complaint Chief Complaint  Patient presents with  . Arm Pain    HPI Joseph Merritt is a 51 y.o. male with history of GERD who presents with left elbow pain after hyperextension injury at work.  Patient reports a wire got away from him and pulled his arm straight out.  He now has pain in his anterior elbow.  It is worse with movement.  He denies numbness or tingling.  He took Aleve around lunchtime before the incident happened for an unrelated knee pain.  He denies pain elsewhere including left shoulder and wrist.  HPI  Past Medical History:  Diagnosis Date  . GERD (gastroesophageal reflux disease)     Patient Active Problem List   Diagnosis Date Noted  . Hyperkalemia 01/28/2015  . Left medial knee pain 12/27/2014  . HTN (hypertension) 12/27/2014  . Annual physical exam 12/27/2014    Past Surgical History:  Procedure Laterality Date  . HAND TENDON SURGERY Right 2007   hand caught between copper at work       Home Medications    Prior to Admission medications   Medication Sig Start Date End Date Taking? Authorizing Provider  azithromycin (ZITHROMAX) 250 MG tablet Take 2 tablets on day 1 and 1 tablet daily after that 06/11/16   Elenora GammaBradshaw, Samuel L, MD  hydrochlorothiazide (HYDRODIURIL) 25 MG tablet TAKE 1 TABLET (25 MG TOTAL) BY MOUTH DAILY. 02/17/16   Elenora GammaBradshaw, Samuel L, MD    Family History Family History  Problem Relation Age of Onset  . Diabetes Mother   . Hypertension Mother   . Hypertension Father     Social History Social History   Tobacco Use  . Smoking status: Never Smoker  . Smokeless tobacco: Never Used  Substance Use Topics  . Alcohol use: No  . Drug use: No     Allergies   Patient has no known allergies.   Review of Systems Review of Systems  Musculoskeletal: Positive for arthralgias (L elbow). Negative for joint  swelling.  Neurological: Negative for weakness and numbness.     Physical Exam Updated Vital Signs BP (!) 146/97   Pulse 72   Temp 98.4 F (36.9 C) (Oral)   Resp 18   Ht 5\' 8"  (1.727 m)   Wt 89.8 kg (198 lb)   SpO2 97%   BMI 30.11 kg/m   Physical Exam  Constitutional: He appears well-developed and well-nourished. No distress.  HENT:  Head: Normocephalic and atraumatic.  Mouth/Throat: Oropharynx is clear and moist. No oropharyngeal exudate.  Eyes: Conjunctivae are normal. Pupils are equal, round, and reactive to light. Right eye exhibits no discharge. Left eye exhibits no discharge. No scleral icterus.  Neck: Normal range of motion. Neck supple. No thyromegaly present.  Cardiovascular: Normal rate, regular rhythm, normal heart sounds and intact distal pulses. Exam reveals no gallop and no friction rub.  No murmur heard. Pulmonary/Chest: Effort normal and breath sounds normal. No stridor. No respiratory distress. He has no wheezes. He has no rales.  Abdominal: Soft. Bowel sounds are normal. He exhibits no distension. There is no tenderness. There is no rebound and no guarding.  Musculoskeletal: He exhibits no edema.       Left elbow: He exhibits normal range of motion.  Tenderness over the biceps tendon, full range of motion of elbow and 5/5 strength with elbow flexion and  extension; pain with extension; equal bilateral grip strength; no edema or ecchymosis; biceps tendon not palpated symmetrically, unable to hook the tendon on the L side; pain increases with supination  Lymphadenopathy:    He has no cervical adenopathy.  Neurological: He is alert. Coordination normal.  Skin: Skin is warm and dry. No rash noted. He is not diaphoretic. No pallor.  Psychiatric: He has a normal mood and affect.  Nursing note and vitals reviewed.    ED Treatments / Results  Labs (all labs ordered are listed, but only abnormal results are displayed) Labs Reviewed - No data to display  EKG  EKG  Interpretation None       Radiology Dg Elbow Complete Left  Result Date: 03/03/2017 CLINICAL DATA:  Hyperextension injury with left elbow pain. EXAM: LEFT ELBOW - COMPLETE 3+ VIEW COMPARISON:  None. FINDINGS: There mild degenerate changes of the elbow joint. There is no definite acute fracture or dislocation. No displaced fat pads. Prominent posterior enthesophyte over the olecranon. IMPRESSION: No acute findings. Mild degenerative changes. Electronically Signed   By: Joseph Merritt  Boyle M.D.   On: 03/03/2017 15:47    Procedures Procedures (including critical care time)  Medications Ordered in ED Medications - No data to display   Initial Impression / Assessment and Plan / ED Course  I have reviewed the triage vital signs and the nursing notes.  Pertinent labs & imaging results that were available during my care of the patient were reviewed by me and considered in my medical decision making (see chart for details).     Patient was suspected, at least partial, biceps tendon avulsion or tear.  Range of motion, and neurovascularly intact.  X-ray shows no acute findings.  I consulted orthopedist, Dr. Romeo AppleHarrison, who advised placement in sling and follow-up to his office in 2 days.  Supportive treatment including NSAIDs, ice, and sling discussed with patient.  Return precautions discussed.  Patient understands and agrees with plan.  Patient vitals stable throughout ED course and discharged in satisfactory condition.  I also discussed the case with Dr. Clarene DukeMcManus who guided the patient's management and agrees with plan.  Final Clinical Impressions(s) / ED Diagnoses   Final diagnoses:  Biceps tendon tear    ED Discharge Orders    None       Emi HolesLaw, Quinlan Mcfall M, PA-C 03/03/17 1647    Samuel JesterMcManus, Kathleen, DO 03/05/17 [phone]

## 2017-03-03 NOTE — Discharge Instructions (Signed)
Take ibuprofen or Aleve as prescribed over-the-counter.  Wear your splint at all times except with bathing.  Use ice 3-4 times daily alternating 20 minutes on, 20 minutes off.  Please follow-up with Dr. Romeo AppleHarrison by calling his office on Wednesday to make an appointment.

## 2017-03-15 HISTORY — PX: BICEPS TENDON REPAIR: SHX566

## 2017-06-14 ENCOUNTER — Ambulatory Visit (INDEPENDENT_AMBULATORY_CARE_PROVIDER_SITE_OTHER): Payer: BLUE CROSS/BLUE SHIELD | Admitting: Family Medicine

## 2017-06-14 ENCOUNTER — Encounter: Payer: Self-pay | Admitting: Family Medicine

## 2017-06-14 VITALS — BP 129/86 | HR 72 | Temp 97.1°F | Ht 68.0 in | Wt 199.8 lb

## 2017-06-14 DIAGNOSIS — M25561 Pain in right knee: Secondary | ICD-10-CM

## 2017-06-14 DIAGNOSIS — Z0000 Encounter for general adult medical examination without abnormal findings: Secondary | ICD-10-CM | POA: Diagnosis not present

## 2017-06-14 NOTE — Progress Notes (Signed)
   HPI  Patient presents today here for an annual physical as well as right knee pain.  Patient states that he is physically active at work, he has been on light duty since his biceps tendon rupture. He does not exercise regularly.  He watches his diet minimally. He would like to return for labs.  Right knee pain Is been bothering him for about a month, no direct inciting event.  Over the last week it has been worse.  He reports posterior right sided knee pain.  Is worse with walking up or down stairs, he has to walk down stairs very carefully. He does have popping symptoms. No locking symptoms.    PMH: Smoking status noted ROS: Per HPI  Objective: BP 129/86   Pulse 72   Temp (!) 97.1 F (36.2 C) (Oral)   Ht _0  (1.727 m)   Wt 199 lb 12.8 oz (90.6 kg)   BMI 30.38 kg/m  Gen: NAD, alert, cooperative with exam HEENT: NCAT, EOMI, PERRL, TMs normal bilaterally, oropharynx moist and clear, nares clear CV: RRR, good S1/S2, no murmur Resp: CTABL, no wheezes, non-labored Abd: SNTND, BS present, no guarding or organomegaly Ext: No edema, warm Neuro: Alert and oriented, No gross deficits MSK: R knee without erythema, effusion, bruising, or gross deformity No joint line tenderness.  ligamentously intact to Lachman's and with varus and valgus stress.  Negative McMurray's test Tenderness to palpation over the distal biceps femoris tendon  Assessment and plan:  #Annual physical exam Normal exam, except for weight. LAbs- will return for labs.   # R knee pain Unusual posterior R knee pain, possible distal hamstring tendonitis Pain is moderate to severe He is wearing a brace Sports med referral.    Orders Placed This Encounter  Procedures  . CMP14+EGFR  . CBC with Differential/Platelet  . Lipid panel  . TSH  . PSA  . Ambulatory referral to Sports Medicine    Referral Priority:   Routine    Referral Type:   Consultation    Referred to Provider:   Gerda Diss, DO     Number of Visits Requested:   1  . Ambulatory referral to Gastroenterology    Referral Priority:   Routine    Referral Type:   Consultation    Referral Reason:   Specialty Services Required    Number of Visits Requested:   [place], MD [facility] 06/14/2017, 1:43 PM

## 2017-06-14 NOTE — Patient Instructions (Signed)
Great to see you!  Consider A colonoscopy  We will work on a referral to sports medicine.

## 2017-06-19 ENCOUNTER — Other Ambulatory Visit: Payer: BLUE CROSS/BLUE SHIELD

## 2017-06-20 ENCOUNTER — Encounter: Payer: Self-pay | Admitting: Family Medicine

## 2017-06-20 LAB — CBC WITH DIFFERENTIAL/PLATELET
Basophils Absolute: 0.1 10*3/uL (ref 0.0–0.2)
Basos: 1 %
EOS (ABSOLUTE): 0.5 10*3/uL — AB (ref 0.0–0.4)
Eos: 6 %
Hematocrit: 45.1 % (ref 37.5–51.0)
Hemoglobin: 15.4 g/dL (ref 13.0–17.7)
IMMATURE GRANULOCYTES: 0 %
Immature Grans (Abs): 0 10*3/uL (ref 0.0–0.1)
Lymphocytes Absolute: 2.5 10*3/uL (ref 0.7–3.1)
Lymphs: 31 %
MCH: 28.9 pg (ref 26.6–33.0)
MCHC: 34.1 g/dL (ref 31.5–35.7)
MCV: 85 fL (ref 79–97)
MONOS ABS: 0.8 10*3/uL (ref 0.1–0.9)
Monocytes: 10 %
NEUTROS PCT: 52 %
Neutrophils Absolute: 4.1 10*3/uL (ref 1.4–7.0)
Platelets: 295 10*3/uL (ref 150–379)
RBC: 5.33 x10E6/uL (ref 4.14–5.80)
RDW: 14.1 % (ref 12.3–15.4)
WBC: 8 10*3/uL (ref 3.4–10.8)

## 2017-06-20 LAB — TSH: TSH: 1.58 u[IU]/mL (ref 0.450–4.500)

## 2017-06-20 LAB — CMP14+EGFR
ALK PHOS: 87 IU/L (ref 39–117)
ALT: 23 IU/L (ref 0–44)
AST: 25 IU/L (ref 0–40)
Albumin/Globulin Ratio: 1.7 (ref 1.2–2.2)
Albumin: 4.4 g/dL (ref 3.5–5.5)
BUN/Creatinine Ratio: 16 (ref 9–20)
BUN: 15 mg/dL (ref 6–24)
Bilirubin Total: 0.4 mg/dL (ref 0.0–1.2)
CO2: 24 mmol/L (ref 20–29)
CREATININE: 0.92 mg/dL (ref 0.76–1.27)
Calcium: 9.3 mg/dL (ref 8.7–10.2)
Chloride: 105 mmol/L (ref 96–106)
GFR calc Af Amer: 112 mL/min/{1.73_m2} (ref >59)
GFR calc non Af Amer: 97 mL/min/{1.73_m2} (ref >59)
GLOBULIN, TOTAL: 2.6 g/dL (ref 1.5–4.5)
Glucose: 84 mg/dL (ref 65–99)
Potassium: 4.4 mmol/L (ref 3.5–5.2)
SODIUM: 143 mmol/L (ref 134–144)
Total Protein: 7 g/dL (ref 6.0–8.5)

## 2017-06-20 LAB — LIPID PANEL
Chol/HDL Ratio: 4.2 ratio (ref 0.0–5.0)
Cholesterol, Total: 152 mg/dL (ref 100–199)
HDL: 36 mg/dL — AB (ref >39)
LDL Calculated: 97 mg/dL (ref 0–99)
TRIGLYCERIDES: 93 mg/dL (ref 0–149)
VLDL Cholesterol Cal: 19 mg/dL (ref 5–40)

## 2017-06-20 LAB — PSA: PROSTATE SPECIFIC AG, SERUM: 0.7 ng/mL (ref 0.0–4.0)

## 2017-07-09 ENCOUNTER — Ambulatory Visit: Payer: Self-pay | Admitting: Sports Medicine

## 2017-07-09 ENCOUNTER — Encounter: Payer: Self-pay | Admitting: Sports Medicine

## 2017-07-09 ENCOUNTER — Ambulatory Visit: Payer: Self-pay

## 2017-07-09 VITALS — BP 132/90 | HR 86 | Ht 68.0 in | Wt 200.8 lb

## 2017-07-09 DIAGNOSIS — M25561 Pain in right knee: Secondary | ICD-10-CM | POA: Diagnosis not present

## 2017-07-09 NOTE — Progress Notes (Signed)
[facility]. [facility] [phone]  Joseph [facility]itt - 52 y.o. male MRN [id number]  Date of birth: November 09, 1965  Visit Date: 07/09/2017  PCP: Elenora Gamma, MD   Referred by: Elenora Gamma, MD  Scribe for today's visit: Christoper Fabian, LAT, ATC     SUBJECTIVE:  Joseph Merritt is here for New Patient (Initial Visit) (R knee pain) .  Referred by: Dr. Kevin Fenton His R knee pain but sometimes pain in B knees symptoms INITIALLY: Began approximately one month ago w/ no MOI.  He notes pain began as R post-lateral pain but has transitioned to R medial knee pain. Described as mod-severe, sharp pain, radiating to R thigh and lower leg. Worsened with climbing stairs or sitting for prolonged periods of time Improved with hamstring stretches, knee brace (OTC) Additional associated symptoms include: R knee popping/clicking but no locking; some R medial knee swelling noted    At this time symptoms show no change compared to onset  He has been wearing a knee brace for work and varies Tylenol, Advil and Aleve.  ROS Reports night time disturbances. Denies fevers, chills, or night sweats. Denies unexplained weight loss. Denies personal history of cancer. Denies changes in bowel or bladder habits. Denies recent unreported falls. Denies new or worsening dyspnea or wheezing. Denies headaches or dizziness.  Reports numbness, tingling or weakness  In the extremities - tingling in the R hand Denies dizziness or presyncopal episodes Reports lower extremity edema - R knee swelling   HISTORY & PERTINENT PRIOR DATA:  Prior History reviewed and updated per electronic medical record.  Significant/pertinent history, findings, studies include:  reports that he has never smoked. He has never used smokeless tobacco. No results for input(s): HGBA1C, LABURIC, CREATINE in the last 8760 hours. No specialty comments available. No problems  updated.  OBJECTIVE:  VS:  HT:5\' 8"  (172.7 cm)   WT:200 lb 12.8 oz (91.1 kg)  BMI:30.54    BP:132/90  HR:86bpm  TEMP: ( )  RESP:95 %   PHYSICAL EXAM: Constitutional: WDWN, Non-toxic appearing. Psychiatric: Alert & appropriately interactive.  Not depressed or anxious appearing. Respiratory: No increased work of breathing.  Trachea Midline Eyes: Pupils are equal.  EOM intact without nystagmus.  No scleral icterus  Vascular Exam: warm to touch no edema  lower extremity neuro exam: unremarkable  MSK Exam: Pain with flexion extension of the knee.  He has full extensor mechanism strength.  His knee has a mild amount of pain with palpation of the medial and lateral joint lines.  No significant effusion.   ASSESSMENT & PLAN:   1. Acute pain of right knee     PLAN: He does have a small amount change on his MSK ultrasound suggestive of medial meniscal tear with generalized synovitis.  We will go ahead and inject it today and plan to follow-up with him in 6 weeks to ensure clinical resolution.  Follow-up: Return in about 6 weeks (around 08/20/2017).      Please see additional documentation for Objective, Assessment and Plan sections. Pertinent additional documentation may be included in corresponding procedure notes, imaging studies, problem based documentation and patient instructions. Please see these sections of the encounter for additional information regarding this visit.  CMA/ATC served as Neurosurgeon during this visit. History, Physical, and Plan performed by medical provider. Documentation and orders reviewed and attested to.      Andrena Mews, DO    [name]  Sports Medicine Physician

## 2017-07-09 NOTE — Patient Instructions (Addendum)

## 2017-07-20 ENCOUNTER — Encounter: Payer: Self-pay | Admitting: Sports Medicine

## 2017-07-20 NOTE — Progress Notes (Signed)

## 2017-07-20 NOTE — Procedures (Signed)
PROCEDURE NOTE:  Ultrasound Guided: Injection: Right knee Images were obtained and interpreted by myself, Gaspar Bidding, DO  Images have been saved and stored to PACS system. Images obtained on: GE S7 Ultrasound machine    ULTRASOUND FINDINGS:  Patellar tendon is intact.  Quadriceps tendon is normal.  Is a small amount of synovitis and small supraphysiologic effusion.  Minimal degenerative change of the medial and lateral joint lines and a small minimal medial meniscal bulge.  DESCRIPTION OF PROCEDURE:  The patient's clinical condition is marked by substantial pain and/or significant functional disability. Other conservative therapy has not provided relief, is contraindicated, or not appropriate. There is a reasonable likelihood that injection will significantly improve the patient's pain and/or functional impairment.   After discussing the risks, benefits and expected outcomes of the injection and all questions were reviewed and answered, the patient wished to undergo the above named procedure.  Verbal consent was obtained.  The ultrasound was used to identify the target structure and adjacent neurovascular structures. The skin was then prepped in sterile fashion and the target structure was injected under direct visualization using sterile technique as below:  PREP: Alcohol and Ethel Chloride APPROACH: superiolateral, single injection, 21g 2 in. INJECTATE: 2 cc 0.5% Marcaine and 2 cc /mL DepoMedrol ASPIRATE: None DRESSING: Band-Aid  Post procedural instructions including recommending icing and warning signs for infection were reviewed.    This procedure was well tolerated and there were no complications.   IMPRESSION: Succesful Ultrasound Guided: Injection

## 2017-07-31 ENCOUNTER — Ambulatory Visit (INDEPENDENT_AMBULATORY_CARE_PROVIDER_SITE_OTHER): Payer: Self-pay

## 2017-07-31 DIAGNOSIS — Z1211 Encounter for screening for malignant neoplasm of colon: Secondary | ICD-10-CM

## 2017-07-31 MED ORDER — PEG 3350-KCL-NA BICARB-NACL 420 G PO SOLR: 4000.0000 mL | ORAL | 0 refills | Status: DC

## 2017-07-31 NOTE — Patient Instructions (Signed)
[facility]   [birth year] MRN: [id number]    Procedure Date: 10/10/17 Time to register: 7:30am Place to register: [facility] Procedure Time: 8:30am Scheduled provider: Barney Drain, MD  PREPARATION FOR COLONOSCOPY WITH TRI-LYTE SPLIT PREP  Please notify us immediately if you are diabetic, take iron supplements, or if you are on Coumadin or any other blood thinners.     You will need to purchase 1 fleet enema and 1 box of Bisacodyl '5mg'$  tablets.    1 DAY BEFORE PROCEDURE:  DATE: 10/09/17   DAY: Wednesday Continue clear liquids the entire day - NO SOLID FOOD.     At 2:00 pm:  Take 2 Bisacodyl tablets.   At 4:00pm:  Start drinking your solution. Make sure you mix well per instructions on the bottle. Try to drink 1 (one) 8 ounce glass every 10-15 minutes until you have consumed HALF the jug. You should complete by 6:00pm.You must keep the left over solution refrigerated until completed next day.  Continue clear liquids. You must drink plenty of clear liquids to prevent dehyration and kidney failure. Nothing to eat or drink after midnight.  EXCEPTION: If you take medications for your heart, blood pressure or breathing, you may take these medications with a small amount of clear liquid.    DAY OF PROCEDURE:   DATE: 10/10/17 DAY: Thursday    Five hours before your procedure time @ 3:30am:  Finish remaining amout of bowel prep, drinking 1 (one) 8 ounce glass every 10-15 minutes until complete. You have two hours to consume remaining prep.   Three hours before your procedure time '@5'$ :30am:  Nothing by mouth.   At least one hour before going to the hospital:  Give yourself one Fleet enema. You may take your morning medications with sip of water unless we have instructed otherwise.      Please see below for Dietary Information.  CLEAR LIQUIDS INCLUDE:  Water Jello (NOT red in color)   Ice Popsicles (NOT red in color)   Tea (sugar ok, no milk/cream) Powdered fruit flavored  drinks  Coffee (sugar ok, no milk/cream) Gatorade/ Lemonade/ Kool-Aid  (NOT red in color)   Juice: apple, white grape, white cranberry Soft drinks  Clear bullion, consomme, broth (fat free beef/chicken/vegetable)  Carbonated beverages (any kind)  Strained chicken noodle soup Hard Candy   Remember: Clear liquids are liquids that will allow you to see your fingers on the other side of a clear glass. Be sure liquids are NOT red in color, and not cloudy, but CLEAR.  DO NOT EAT OR DRINK ANY OF THE FOLLOWING:  Dairy products of any kind   Cranberry juice Tomato juice / V8 juice   Grapefruit juice Orange juice     Red grape juice  Do not eat any solid foods, including such foods as: cereal, oatmeal, yogurt, fruits, vegetables, creamed soups, eggs, bread, crackers, pureed foods in a blender, etc.   HELPFUL HINTS FOR DRINKING PREP SOLUTION:   Make sure prep is extremely cold. Mix and refrigerate the the morning of the prep. You may also put in the freezer.   You may try mixing some Crystal Light or Country Time Lemonade if you prefer. Mix in small amounts; add more if necessary.  Try drinking through a straw  Rinse mouth with water or a mouthwash between glasses, to remove after-taste.  Try sipping on a cold beverage /ice/ popsicles between glasses of prep.  Place a piece of sugar-free hard candy in mouth  between glasses.  If you become nauseated, try consuming smaller amounts, or stretch out the time between glasses. Stop for 30-60 minutes, then slowly start back drinking.        OTHER INSTRUCTIONS  You will need a responsible adult at least 51 years of age to accompany you and drive you home. This person must remain in the waiting room during your procedure. The hospital will cancel your procedure if you do not have a responsible adult with you.   1. Wear loose fitting clothing that is easily removed. 2. Leave jewelry and other valuables at home.  3. Remove all body piercing  jewelry and leave at home. 4. Total time from sign-in until discharge is approximately 2-3 hours. 5. You should go home directly after your procedure and rest. You can resume normal activities the day after your procedure. 6. The day of your procedure you should not:  Drive  Make legal decisions  Operate machinery  Drink alcohol  Return to work   You may call the office (Dept: [phone]) before 5:00pm, or page the doctor on call [phone]) after 5:00pm, for further instructions, if necessary.   Insurance Information YOU WILL NEED TO CHECK WITH YOUR INSURANCE COMPANY FOR THE BENEFITS OF COVERAGE YOU HAVE FOR THIS PROCEDURE.  UNFORTUNATELY, NOT ALL INSURANCE COMPANIES HAVE BENEFITS TO COVER ALL OR PART OF THESE TYPES OF PROCEDURES.  IT IS YOUR RESPONSIBILITY TO CHECK YOUR BENEFITS, HOWEVER, WE WILL BE GLAD TO ASSIST YOU WITH ANY CODES YOUR INSURANCE COMPANY MAY NEED.    PLEASE NOTE THAT MOST INSURANCE COMPANIES WILL NOT COVER A SCREENING COLONOSCOPY FOR PEOPLE UNDER THE AGE OF 50  IF YOU HAVE BCBS INSURANCE, YOU MAY HAVE BENEFITS FOR A SCREENING COLONOSCOPY BUT IF POLYPS ARE FOUND THE DIAGNOSIS WILL CHANGE AND THEN YOU MAY HAVE A DEDUCTIBLE THAT WILL NEED TO BE MET. SO PLEASE MAKE SURE YOU CHECK YOUR BENEFITS FOR A SCREENING COLONOSCOPY AS WELL AS A DIAGNOSTIC COLONOSCOPY.

## 2017-07-31 NOTE — Progress Notes (Signed)
Gastroenterology Pre-Procedure Review  Request Date:07/31/17 Requesting Physician: Dr.Bradshaw-[facility]-(no previous tcs)  PATIENT REVIEW QUESTIONS: The patient responded to the following health history questions as indicated:    1. Diabetes Melitis: no 2. Joint replacements in the past 12 months: no 3. Major health problems in the past 3 months: no 4. Has an artificial valve or MVP: no 5. Has a defibrillator: no 6. Has been advised in past to take antibiotics in advance of a procedure like teeth cleaning: no 7. Family history of colon cancer: no  8. Alcohol Use: no 9. History of sleep apnea: no  10. History of coronary artery or other vascular stents placed within the last 12 months: no 11. History of any prior anesthesia complications: no    MEDICATIONS & ALLERGIES:    Patient reports the following regarding taking any blood thinners:   Plavix? no Aspirin? no Coumadin? no Brilinta? no Xarelto? no Eliquis? no Pradaxa? no Savaysa? no Effient? no  Patient confirms/reports the following medications:  Current Outpatient Medications  Medication Sig Dispense Refill  . ibuprofen (ADVIL,MOTRIN) 200 MG tablet Take 200 mg by mouth every 6 (six) hours as needed.     No current facility-administered medications for this visit.     Patient confirms/reports the following allergies:  No Known Allergies  No orders of the defined types were placed in this encounter.   AUTHORIZATION INFORMATION Primary Insurance: [place],  [place] #: [id number] Pre-Cert / Berkley Harvey required: no   SCHEDULE INFORMATION: Procedure has been scheduled as follows:  Date: 10/10/17, Time: 8:30  Location: [facility]  This Gastroenterology Pre-Precedure Review Form is being routed to the following provider(s): Tana Coast, PA

## 2017-08-01 NOTE — Progress Notes (Signed)
Ok to schedule.

## 2017-08-20 ENCOUNTER — Ambulatory Visit (INDEPENDENT_AMBULATORY_CARE_PROVIDER_SITE_OTHER): Payer: BLUE CROSS/BLUE SHIELD

## 2017-08-20 ENCOUNTER — Ambulatory Visit: Payer: BLUE CROSS/BLUE SHIELD | Admitting: Sports Medicine

## 2017-08-20 ENCOUNTER — Encounter: Payer: Self-pay | Admitting: Sports Medicine

## 2017-08-20 VITALS — BP 138/90 | HR 84 | Ht 68.0 in | Wt 194.6 lb

## 2017-08-20 DIAGNOSIS — M25561 Pain in right knee: Secondary | ICD-10-CM

## 2017-08-20 DIAGNOSIS — M25461 Effusion, right knee: Secondary | ICD-10-CM

## 2017-08-20 MED ORDER — DICLOFENAC SODIUM 2 % TD SOLN: 1.0000 "application " | Freq: Two times a day (BID) | TRANSDERMAL | 0 refills | Status: AC

## 2017-08-20 MED ORDER — DICLOFENAC SODIUM 2 % TD SOLN: 1.0000 "application " | Freq: Two times a day (BID) | TRANSDERMAL | 2 refills | Status: DC

## 2017-08-20 NOTE — Patient Instructions (Signed)
[facility] pharmacy instructions for Duexis, Pennsaid and Vimovo:  Your prescription will be filled through a mail order pharmacy.  It is typically [facility] but may vary depending on where you live.  You will receive a phone call from them which will typically come from a [phone] phone number.  You must speak directly to them to have this medication filled.  When the pharmacy calls, they will need your mailing address (for overnight shipment of the medication) andy they will need payment information if you have a copay (typically no more than $10). If you have not heard from them 2-3 days after your appointment with Dr. Rigby, contact us at the office ([phone]) or through MyChart so we can reach back out to the pharmacy.  

## 2017-08-20 NOTE — Progress Notes (Signed)
  Veverly FellsMichael D. Delorise Shinerigby, DO  [facility] [place] [phone]  Dot LanesLeon W Moor - 52 y.o. male MRN [id number]  Date of birth: February 24, 1966  Visit Date:   PCP: Elenora GammaBradshaw, Samuel L, MD   Referred by: Elenora GammaBradshaw, Samuel L, MD  HISTORY & PERTINENT PRIOR DATA:  Significant/pertinent history, findings, studies include:  reports that he has never smoked. He has never used smokeless tobacco. No results for input(s): HGBA1C, LABURIC, CREATINE in the last 8760 hours. No specialty comments available. No problems updated. Past Medical History:  Diagnosis Date  . GERD (gastroesophageal reflux disease)    Past Surgical History:  Procedure Laterality Date  . BICEPS TENDON REPAIR Left 03/15/2017  . HAND TENDON SURGERY Right 2007   hand caught between copper at work   family history includes Diabetes in his mother; Hypertension in his father and mother. Social History   Occupational History  . Not on file  Tobacco Use  . Smoking status: Never Smoker  . Smokeless tobacco: Never Used  Substance and Sexual Activity  . Alcohol use: No  . Drug use: No  . Sexual activity: Not on file   Current Outpatient Medications on File Prior to Visit  Medication Sig Dispense Refill  . ibuprofen (ADVIL,MOTRIN) 200 MG tablet Take 200 mg by mouth every 6 (six) hours as needed.    . polyethylene glycol-electrolytes (TRILYTE) 420 g solution Take 4,000 mLs by mouth as directed. 4000 mL 0   No current facility-administered medications on file prior to visit.    No Known Allergies   OBJECTIVE:  VS:  HT:5\' 8"  (172.7 cm)   WT:194 lb 9.6 oz (88.3 kg)  BMI:29.6    TEMP: ( ) BP:138/90  HR:84bpm  RESP:96 %  PHYSICAL EXAM: WDWN, Non-toxic appearing. Psychiatric: Alert & appropriately interactive.  Not depressed or anxious appearing. Respiratory: No increased work of breathing.  Trachea Midline Eyes: Pupils are equal.  EOM intact without nystagmus.  No scleral  icterus  Vascular Exam: warm to touch no edema  lower extremity neuro exam: unremarkable  MSK Exam: Right Knee: small effusion Generalized medial and lateral joint line pain.  No focal tenderness palpation.  Minimal pain with McMurray's testing.  No appreciable clicking. Extensor mechanism intact.   ASSESSMENT & PLAN:  1. Acute pain of right knee   2. Effusion of right knee     PLAN: Topical Pennsaid Avoidance of exacerbating activities Discussed the option for injection.  He will follow-up if any lack of improvement or worsening symptoms.  Follow-up: Return in about 8 weeks (around 10/15/2017).   Pertinent documentation may be included in additional procedure notes, imaging studies, problem based documentation and patient instructions. Please see these sections of the encounter for additional information regarding this visit.      Andrena MewsMichael D Rigby, DO    [facility]

## 2017-08-20 NOTE — Progress Notes (Signed)
  Veverly FellsMichael D. Delorise Shinerigby, DO  [facility] [place] [phone]  Dot LanesLeon W Mesick - 52 y.o. male MRN [id number]  Date of birth: [birth year]  Visit Date: 08/20/2017  PCP: Elenora GammaBradshaw, Samuel L, MD   Referred by: Elenora GammaBradshaw, Samuel L, MD  Scribe(s) for today's visit: Christoper FabianMolly Weber, LAT, ATC  SUBJECTIVE:  Dot LanesLeon W Schellhase is here for Follow-up (R knee pain) .    Info from Initial Visit on 07/09/17: His R knee pain but sometimes pain in B knees symptoms INITIALLY: Began approximately one month ago w/ no MOI.  He notes pain began as R post-lateral pain but has transitioned to R medial knee pain. Described as mod-severe, sharp pain, radiating to R thigh and lower leg. Worsened with climbing stairs or sitting for prolonged periods of time Improved with hamstring stretches, knee brace (OTC) Additional associated symptoms include: R knee popping/clicking but no locking; some R medial knee swelling noted    At this time symptoms show no change compared to onset  He has been wearing a knee brace for work and varies Tylenol, Advil and Aleve.  08/20/17: Compared to the last office visit on 07/09/17, his previously described R knee pain symptoms are improving w/ less intense pain.  He states that he still has a lot of stiffness in his R knee and has pain along the superior-lateral and medial knee. Current symptoms are moderate & are radiating to R thigh. He has been wearing a knee brace and takes Tylenol, Advil and Aleve prn for pain.  He had a R knee injection at his last visit.  He's been doing his HEP about 3x/week.     REVIEW OF SYSTEMS: Reports night time disturbances. Denies fevers, chills, or night sweats. Denies unexplained weight loss. Denies personal history of cancer. Denies changes in bowel or bladder habits. Denies recent unreported falls. Denies new or worsening dyspnea or wheezing. Denies headaches or dizziness.  Denies numbness, tingling or weakness  In  the extremities.  Denies dizziness or presyncopal episodes Reports lower extremity edema      Please see additional documentation for Objective, Assessment and Plan sections. Pertinent additional documentation may be included in corresponding procedure notes, imaging studies, problem based documentation and patient instructions. Please see these sections of the encounter for additional information regarding this visit.  CMA/ATC served as Neurosurgeonscribe during this visit. History, Physical, and Plan performed by medical provider. Documentation and orders reviewed and attested to.      Andrena MewsMichael D Rigby, DO    [facility]ician

## 2017-09-03 ENCOUNTER — Encounter: Payer: Self-pay | Admitting: Sports Medicine

## 2017-09-11 ENCOUNTER — Telehealth: Payer: Self-pay | Admitting: Family Medicine

## 2017-09-11 NOTE — Telephone Encounter (Signed)
Copied from CRM [phone]#[id number]. Topic: Quick Communication - See Telephone Encounter >> Sep 11, 2017  5:24 PM Lorrine KinMcGee, Donterrius Santucci B, NT wrote: CRM for notification. See Telephone encounter for: 09/11/17. Priscilla with [facility] calling and is needing a diagnosis and to know if the patient has tried any other medications before the pensaid? CB#: [phone]

## 2017-09-13 NOTE — Telephone Encounter (Signed)
Called and spoke with pharmacy. Stated that according to Dr Maryln Gottronigbys note the patient has tried, Tylenol, Advil and Aleve and a knee brace. Also reviewed diagnosis. Pharmacy states that they will submit this to the insurance.

## 2017-09-13 NOTE — Telephone Encounter (Signed)
See note

## 2017-09-16 NOTE — Telephone Encounter (Signed)
Noted, no further action needed at this time

## 2017-10-10 ENCOUNTER — Ambulatory Visit (HOSPITAL_COMMUNITY)
Admission: RE | Admit: 2017-10-10 | Discharge: 2017-10-10 | Disposition: A | Discharge status: Home / Self Care | Payer: BLUE CROSS/BLUE SHIELD | Source: Ambulatory Visit | Attending: Gastroenterology | Admitting: Gastroenterology

## 2017-10-10 ENCOUNTER — Encounter (HOSPITAL_COMMUNITY)
Admission: RE | Disposition: A | Discharge status: Home / Self Care | Payer: Self-pay | Source: Ambulatory Visit | Attending: Gastroenterology

## 2017-10-10 ENCOUNTER — Encounter (HOSPITAL_COMMUNITY): Payer: Self-pay

## 2017-10-10 ENCOUNTER — Other Ambulatory Visit: Payer: Self-pay

## 2017-10-10 DIAGNOSIS — K648 Other hemorrhoids: Secondary | ICD-10-CM | POA: Insufficient documentation

## 2017-10-10 DIAGNOSIS — K5730 Diverticulosis of large intestine without perforation or abscess without bleeding: Secondary | ICD-10-CM | POA: Insufficient documentation

## 2017-10-10 DIAGNOSIS — Z1211 Encounter for screening for malignant neoplasm of colon: Secondary | ICD-10-CM

## 2017-10-10 DIAGNOSIS — K219 Gastro-esophageal reflux disease without esophagitis: Secondary | ICD-10-CM | POA: Insufficient documentation

## 2017-10-10 HISTORY — PX: COLONOSCOPY: SHX5424

## 2017-10-10 HISTORY — DX: Nausea with vomiting, unspecified: Z98.890

## 2017-10-10 HISTORY — DX: Nausea with vomiting, unspecified: R11.2

## 2017-10-10 SURGERY — COLONOSCOPY
Anesthesia: Moderate Sedation

## 2017-10-10 MED ORDER — MIDAZOLAM HCL 5 MG/5ML IJ SOLN
INTRAMUSCULAR | Status: DC | PRN
  Administered 2017-10-10: 1 mg via INTRAVENOUS
  Administered 2017-10-10 (×2): 2 mg via INTRAVENOUS

## 2017-10-10 MED ORDER — MIDAZOLAM HCL 5 MG/5ML IJ SOLN
INTRAMUSCULAR | Status: AC
  Filled 2017-10-10: qty 10

## 2017-10-10 MED ORDER — MEPERIDINE HCL 100 MG/ML IJ SOLN
INTRAMUSCULAR | Status: DC | PRN
  Administered 2017-10-10 (×3): 25 mg via INTRAVENOUS

## 2017-10-10 MED ORDER — STERILE WATER FOR IRRIGATION IR SOLN
Status: DC | PRN
  Administered 2017-10-10: 15 mL

## 2017-10-10 MED ORDER — ONDANSETRON HCL 4 MG/2ML IJ SOLN
INTRAMUSCULAR | Status: AC
  Filled 2017-10-10: qty 2

## 2017-10-10 MED ORDER — MEPERIDINE HCL 100 MG/ML IJ SOLN
INTRAMUSCULAR | Status: AC
  Filled 2017-10-10: qty 2

## 2017-10-10 MED ORDER — SODIUM CHLORIDE 0.9 % IV SOLN
INTRAVENOUS | Status: DC
  Administered 2017-10-10: 08:00:00 via INTRAVENOUS

## 2017-10-10 MED ORDER — ONDANSETRON HCL 4 MG/2ML IJ SOLN
INTRAMUSCULAR | Status: DC | PRN
  Administered 2017-10-10: 4 mg via INTRAVENOUS

## 2017-10-10 NOTE — Op Note (Signed)
[facility] Patient Name: Joseph FosterLeon Merritt Procedure Date: 10/10/2017 7:08 AM MRN: [id number] Date of Birth: 1965-03-22 Attending MD: Jonette EvaSandi Tarin Navarez MD, MD CSN: [id number] Age: [age] Admit Type: Outpatient Procedure:                Colonoscopy, SCREENING Indications:              Screening for colorectal malignant neoplasm Providers:                Jonette EvaSandi Shaylea Ucci MD, MD, Buel ReamAngela A. Thomasena Edisollins RN, RN,                            Dyann Ruddleonya Wilson Referring MD:             Pamala DuffelSamuel L. Ermalinda MemosBradshaw Medicines:                Ondansetron 4 mg IV, Meperidine 75 mg IV, Midazolam                            5 mg IV Complications:            No immediate complications. Estimated Blood Loss:     Estimated blood loss: none. Procedure:                Pre-Anesthesia Assessment:                           - Prior to the procedure, a History and Physical                            was performed, and patient medications and                            allergies were reviewed. The patient's tolerance of                            previous anesthesia was also reviewed. The risks                            and benefits of the procedure and the sedation                            options and risks were discussed with the patient.                            All questions were answered, and informed consent                            was obtained. Prior Anticoagulants: The patient has                            taken ibuprofen, last dose was 7 days prior to                            procedure. ASA Grade Assessment: II - A patient  with mild systemic disease. After reviewing the                            risks and benefits, the patient was deemed in                            satisfactory condition to undergo the procedure.                            After obtaining informed consent, the colonoscope                            was passed under direct vision. Throughout the   procedure, the patient's blood pressure, pulse, and                            oxygen saturations were monitored continuously. The                            [id number]-[id number] ([id number]) scope was introduced through                            the anus and advanced to the the cecum, identified                            by appendiceal orifice and ileocecal valve. The                            colonoscopy was somewhat difficult due to                            restricted mobility of the colon. Successful                            completion of the procedure was aided by increasing                            the dose of sedation medication, straightening and                            shortening the scope to obtain bowel loop reduction                            and COLOWRAP. The patient tolerated the procedure                            fairly well. The ileocecal valve, appendiceal                            orifice, and rectum were photographed. The quality                            of the bowel preparation was excellent. Scope In: 8:53:03 AM Scope Out: 9:07:16 AM Scope  Withdrawal Time: 0 hours 10 minutes 21 seconds  Total Procedure Duration: 0 hours 14 minutes 13 seconds  Findings:      Multiple small and large-mouthed diverticula were found in the       recto-sigmoid colon and sigmoid colon.      The recto-sigmoid colon and sigmoid colon were moderately ANGULATED.      Internal hemorrhoids were found. The hemorrhoids were small. Impression:               - MILD Diverticulosis in the recto-sigmoid colon                            and in the sigmoid colon.                           - ANGULATED RECTOSIGMOID colon.                           - Internal hemorrhoids. Moderate Sedation:      Moderate (conscious) sedation was administered by the endoscopy nurse       and supervised by the endoscopist. The following parameters were       monitored: oxygen saturation, heart rate, blood pressure, and  response       to care. Total physician intraservice time was 26 minutes. Recommendation:           - Patient has a contact number available for                            emergencies. The signs and symptoms of potential                            delayed complications were discussed with the                            patient. Return to normal activities tomorrow.                            Written discharge instructions were provided to the                            patient.                           - High fiber diet.                           - Continue present medications.                           - Repeat colonoscopy in 10 years for surveillance. Procedure Code(s):        --- Professional ---                           [phone], Colonoscopy, flexible; diagnostic, including                            collection of specimen(s) by brushing or washing,  when performed (separate procedure)                           G0500, Moderate sedation services provided by the                            same physician or other qualified health care                            professional performing a gastrointestinal                            endoscopic service that sedation supports,                            requiring the presence of an independent trained                            observer to assist in the monitoring of the                            patient's level of consciousness and physiological                            status; initial 15 minutes of intra-service time;                            patient age 19 years or older (additional time may                            be reported with [phone], as appropriate)                           [phone], Moderate sedation services provided by the                            same physician or other qualified health care                            professional performing the diagnostic or                            therapeutic  service that the sedation supports,                            requiring the presence of an independent trained                            observer to assist in the monitoring of the                            patient's level of consciousness and physiological  status; each additional 15 minutes intraservice                            time (List separately in addition to code for                            primary service) Diagnosis Code(s):        --- Professional ---                           Z12.11, Encounter for screening for malignant                            neoplasm of colon                           K64.8, Other hemorrhoids                           K57.30, Diverticulosis of large intestine without                            perforation or abscess without bleeding                           Q43.8, Other specified congenital malformations of                            intestine CPT copyright 2017 American Medical Association. All rights reserved. The codes documented in this report are preliminary and upon coder review may  be revised to meet current compliance requirements. Jonette Eva, MD Jonette Eva MD, MD 10/10/2017 9:25:28 AM This report has been signed electronically. Number of Addenda: 0

## 2017-10-10 NOTE — Discharge Instructions (Signed)
You DID NOT HAVE ANY POLYPS. YOU HAVE DIVERTICULOSIS IN YOUR LEFT COLON. You have SMALL internal hemorrhoids.   DRINK WATER TO KEEP URINE LIGHT YELLOW.   YOUR BMI IS CURRENTLY 29.5. A WEIGHT OF 194 LBS OR LESS WILL KEEP YOUR BODY MASS INDEX(BMI) UNDER 30. A BODY MASS INDEX IS OVER 30 MEANS YOU ARE OBESE. OBESITY IS ASSOCIATED WITH AN INCREASED FOR CIRRHOSIS AND ALL CANCERS, INCLUDING ESOPHAGEAL AND COLON CANCER.  FOLLOW A HIGH FIBER DIET. AVOID ITEMS THAT CAUSE BLOATING & GAS. SEE INFO BELOW.  Next colonoscopy in 10 years.   Colonoscopy Care After Read the instructions outlined below and refer to this sheet in the next week. These discharge instructions provide you with general information on caring for yourself after you leave the hospital. While your treatment has been planned according to the most current medical practices available, unavoidable complications occasionally occur. If you have any problems or questions after discharge, call DR. Tangela Dolliver, [phone].  ACTIVITY  You may resume your regular activity, but move at a slower pace for the next 24 hours.   Take frequent rest periods for the next 24 hours.   Walking will help get rid of the air and reduce the bloated feeling in your belly (abdomen).   No driving for 24 hours (because of the medicine (anesthesia) used during the test).   You may shower.   Do not sign any important legal documents or operate any machinery for 24 hours (because of the anesthesia used during the test).    NUTRITION  Drink plenty of fluids.   You may resume your normal diet as instructed by your doctor.   Begin with a light meal and progress to your normal diet. Heavy or fried foods are harder to digest and may make you feel sick to your stomach (nauseated).   Avoid alcoholic beverages for 24 hours or as instructed.    MEDICATIONS  You may resume your normal medications.   WHAT YOU CAN EXPECT TODAY  Some feelings of bloating in the  abdomen.   Passage of more gas than usual.   Spotting of blood in your stool or on the toilet paper  .  IF YOU HAD POLYPS REMOVED DURING THE COLONOSCOPY:  Eat a soft diet IF YOU HAVE NAUSEA, BLOATING, ABDOMINAL PAIN, OR VOMITING.    FINDING OUT THE RESULTS OF YOUR TEST Not all test results are available during your visit. DR. Darrick Penna WILL CALL YOU WITHIN 7 DAYS OF YOUR PROCEDUE WITH YOUR RESULTS. Do not assume everything is normal if you have not heard from DR. Columbia Pandey IN ONE WEEK, CALL HER OFFICE AT [phone].  SEEK IMMEDIATE MEDICAL ATTENTION AND CALL THE OFFICE: [phone] IF:  You have more than a spotting of blood in your stool.   Your belly is swollen (abdominal distention).   You are nauseated or vomiting.   You have a temperature over 101F.   You have abdominal pain or discomfort that is severe or gets worse throughout the day.    High-Fiber Diet A high-fiber diet changes your normal diet to include more whole grains, legumes, fruits, and vegetables. Changes in the diet involve replacing refined carbohydrates with unrefined foods. The calorie level of the diet is essentially unchanged. The Dietary Reference Intake (recommended amount) for adult males is 38 grams per day. For adult females, it is 25 grams per day. Pregnant and lactating women should consume 28 grams of fiber per day. Fiber is the intact part of a plant that  is not broken down during digestion. Functional fiber is fiber that has been isolated from the plant to provide a beneficial effect in the body. PURPOSE  Increase stool bulk.   Ease and regulate bowel movements.   Lower cholesterol.   REDUCE RISK OF COLON CANCER  INDICATIONS THAT YOU NEED MORE FIBER  Constipation and hemorrhoids.   Uncomplicated diverticulosis (intestine condition) and irritable bowel syndrome.   Weight management.   As a protective measure against hardening of the arteries (atherosclerosis), diabetes, and cancer.    GUIDELINES FOR INCREASING FIBER IN THE DIET  Start adding fiber to the diet slowly. A gradual increase of about 5 more grams (2 slices of whole-wheat bread, 2 servings of most fruits or vegetables, or 1 bowl of high-fiber cereal) per day is best. Too rapid an increase in fiber may result in constipation, flatulence, and bloating.   Drink enough water and fluids to keep your urine clear or pale yellow. Water, juice, or caffeine-free drinks are recommended. Not drinking enough fluid may cause constipation.   Eat a variety of high-fiber foods rather than one type of fiber.   Try to increase your intake of fiber through using high-fiber foods rather than fiber pills or supplements that contain small amounts of fiber.   The goal is to change the types of food eaten. Do not supplement your present diet with high-fiber foods, but replace foods in your present diet.   INCLUDE A VARIETY OF FIBER SOURCES  Replace refined and processed grains with whole grains, canned fruits with fresh fruits, and incorporate other fiber sources. White rice, white breads, and most bakery goods contain little or no fiber.   Brown whole-grain rice, buckwheat oats, and many fruits and vegetables are all good sources of fiber. These include: broccoli, Brussels sprouts, cabbage, cauliflower, beets, sweet potatoes, white potatoes (skin on), carrots, tomatoes, eggplant, squash, berries, fresh fruits, and dried fruits.   Cereals appear to be the richest source of fiber. Cereal fiber is found in whole grains and bran. Bran is the fiber-rich outer coat of cereal grain, which is largely removed in refining. In whole-grain cereals, the bran remains. In breakfast cereals, the largest amount of fiber is found in those with "bran" in their names. The fiber content is sometimes indicated on the label.   You may need to include additional fruits and vegetables each day.   In baking, for 1 cup white flour, you may use the following  substitutions:   1 cup whole-wheat flour minus 2 tablespoons.   1/2 cup white flour plus 1/2 cup whole-wheat flour.   Diverticulosis Diverticulosis is a common condition that develops when small pouches (diverticula) form in the wall of the colon. The risk of diverticulosis increases with age. It happens more often in people who eat a low-fiber diet. Most individuals with diverticulosis have no symptoms. Those individuals with symptoms usually experience belly (abdominal) pain, constipation, or loose stools (diarrhea).  HOME CARE INSTRUCTIONS  Increase the amount of fiber in your diet as directed by your caregiver or dietician. This may reduce symptoms of diverticulosis.   Drink at least 6 to 8 glasses of water each day to prevent constipation.   Try not to strain when you have a bowel movement.   THERE IS NO NEED TO Avoid nuts and seeds to prevent complications.   FOODS HAVING HIGH FIBER CONTENT INCLUDE:  Fruits. Apple, peach, pear, tangerine, raisins, prunes.   Vegetables. Brussels sprouts, asparagus, broccoli, cabbage, carrot, cauliflower, romaine lettuce, spinach,  summer squash, tomato, winter squash, zucchini.   Starchy Vegetables. Baked beans, kidney beans, lima beans, split peas, lentils, potatoes (with skin).   Grains. Whole wheat bread, brown rice, bran flake cereal, plain oatmeal, white rice, shredded wheat, bran muffins.

## 2017-10-10 NOTE — H&P (Signed)
Primary Care Physician:  Elenora Gamma, MD Primary Gastroenterologist:  Dr. Darrick Penna  Pre-Procedure History & Physical: HPI:  Joseph Merritt is a 52 y.o. male here for COLON CANCER SCREENING.  Past Medical History:  Diagnosis Date  . GERD (gastroesophageal reflux disease)   . PONV (postoperative nausea and vomiting)     Past Surgical History:  Procedure Laterality Date  . BICEPS TENDON REPAIR Left 03/15/2017  . HAND TENDON SURGERY Right 2007   hand caught between copper at work    Prior to Admission medications   Medication Sig Start Date End Date Taking? Authorizing Provider  Diclofenac Sodium (PENNSAID) 2 % SOLN Place 1 application onto the skin 2 (two) times daily. Patient taking differently: Place 1 application onto the skin 2 (two) times daily as needed (for knee pain).  08/20/17  Yes Andrena Mews, DO  ibuprofen (ADVIL,MOTRIN) 200 MG tablet Take 600 mg by mouth every 6 (six) hours as needed for headache or moderate pain.    Yes [provider]  OVER THE COUNTER MEDICATION Apply 1 application topically 2 (two) times daily as needed (for knee pain). Hemp Oil Topical   Yes [provider]  polyethylene glycol-electrolytes (TRILYTE) 420 g solution Take 4,000 mLs by mouth as directed. 07/31/17  Yes Tiffany Kocher, PA-C    Allergies as of 07/31/2017  . (No Known Allergies)    Family History  Problem Relation Age of Onset  . Diabetes Mother   . Hypertension Mother   . Hypertension Father     Social History   Socioeconomic History  . Marital status: Legally Separated    Spouse name: Not on file  . Number of children: Not on file  . Years of education: Not on file  . Highest education level: Not on file  Occupational History  . Not on file  Social Needs  . Financial resource strain: Not on file  . Food insecurity:    Worry: Not on file    Inability: Not on file  . Transportation needs:    Medical: Not on file    Non-medical: Not on file   Tobacco Use  . Smoking status: Never Smoker  . Smokeless tobacco: Never Used  Substance and Sexual Activity  . Alcohol use: No  . Drug use: No  . Sexual activity: Not on file  Lifestyle  . Physical activity:    Days per week: Not on file    Minutes per session: Not on file  . Stress: Not on file  Relationships  . Social connections:    Talks on phone: Not on file    Gets together: Not on file    Attends religious service: Not on file    Active member of club or organization: Not on file    Attends meetings of clubs or organizations: Not on file    Relationship status: Not on file  . Intimate partner violence:    Fear of current or ex partner: Not on file    Emotionally abused: Not on file    Physically abused: Not on file    Forced sexual activity: Not on file  Other Topics Concern  . Not on file  Social History Narrative  . Not on file    Review of Systems: See HPI, otherwise negative ROS   Physical Exam: BP 118/85   Pulse 69   Temp 97.9 F (36.6 C) (Oral)   Resp 11   Ht 5\' 8"  (1.727 m)   Wt  194 lb (88 kg)   SpO2 98%   BMI 29.50 kg/m  General:   Alert,  pleasant and cooperative in NAD Head:  Normocephalic and atraumatic. Neck:  Supple; Lungs:  Clear throughout to auscultation.    Heart:  Regular rate and rhythm. Abdomen:  Soft, nontender and nondistended. Normal bowel sounds, without guarding, and without rebound.   Neurologic:  Alert and  oriented x4;  grossly normal neurologically.  Impression/Plan:    SCREENING  Plan:  1. TCS TODAY DISCUSSED PROCEDURE, BENEFITS, & RISKS: < 1% chance of medication reaction, bleeding, perforation, or rupture of spleen/liver.

## 2017-10-15 ENCOUNTER — Ambulatory Visit (INDEPENDENT_AMBULATORY_CARE_PROVIDER_SITE_OTHER): Payer: BLUE CROSS/BLUE SHIELD | Admitting: Sports Medicine

## 2017-10-15 ENCOUNTER — Encounter: Payer: Self-pay | Admitting: Sports Medicine

## 2017-10-15 VITALS — BP 120/92 | HR 95 | Ht 68.0 in | Wt 194.6 lb

## 2017-10-15 DIAGNOSIS — M25461 Effusion, right knee: Secondary | ICD-10-CM | POA: Diagnosis not present

## 2017-10-15 DIAGNOSIS — M24559 Contracture, unspecified hip: Secondary | ICD-10-CM

## 2017-10-15 DIAGNOSIS — M25561 Pain in right knee: Secondary | ICD-10-CM

## 2017-10-15 DIAGNOSIS — R29898 Other symptoms and signs involving the musculoskeletal system: Secondary | ICD-10-CM

## 2017-10-15 NOTE — Progress Notes (Signed)
Joseph Merritt. [facility] at [facility] [phone]  Joseph Merritt - 52 y.o. male MRN [id number]  Date of birth: 05-17-1965  Visit Date: 10/15/2017  PCP: Elenora Gamma, MD   Referred by: Elenora Gamma, MD  Scribe(s) for today's visit: Christoper Fabian, LAT, ATC  SUBJECTIVE:  Joseph Merritt is here for Follow-up (R knee pain) .   Info from Initial Visit on 07/09/17: His R knee pain but sometimes pain in B knees symptoms INITIALLY: Began approximately one month ago w/ no MOI.  He notes pain began as R post-lateral pain but has transitioned to R medial knee pain. Described as mod-severe, sharp pain, radiating to R thigh and lower leg. Worsened with climbing stairs or sitting for prolonged periods of time Improved with hamstring stretches, knee brace (OTC) Additional associated symptoms include: R knee popping/clicking but no locking; some R medial knee swelling noted    At this time symptoms show no change compared to onset  He has been wearing a knee brace for work and varies Tylenol, Advil and Aleve.  08/20/17: Compared to the last office visit on 07/09/17, his previously described R knee pain symptoms are improving w/ less intense pain.  He states that he still has a lot of stiffness in his R knee and has pain along the superior-lateral and medial knee. Current symptoms are moderate & are radiating to R thigh. He has been wearing a knee brace and takes Tylenol, Advil and Aleve prn for pain.  He had a R knee injection at his last visit.  He's been doing his HEP about 3x/week.    10/15/2017: Compared to the last office visit on 08/20/17, his previously described R knee pain symptoms are improving w/ less pain noted w/ ascending/descending stairs.  He feels approximately 75% improvement. Current symptoms are mild & are nonradiating He has been using Pennsaid prn and wearing a knee brace occasionally.  He's not been doing his HEP x/week.  He  had a R knee injection on 07/09/17.  R knee XR: 08/20/17  REVIEW OF SYSTEMS: Denies night time disturbances. Denies fevers, chills, or night sweats. Denies unexplained weight loss. Denies personal history of cancer. Denies changes in bowel or bladder habits. Denies recent unreported falls. Denies new or worsening dyspnea or wheezing. Denies headaches or dizziness.  Denies numbness, tingling or weakness  In the extremities.  Denies dizziness or presyncopal episodes Reports lower extremity edema - occasional   HISTORY:  Prior history reviewed and updated per electronic medical record.  Social History   Occupational History  . Not on file  Tobacco Use  . Smoking status: Never Smoker  . Smokeless tobacco: Never Used  Substance and Sexual Activity  . Alcohol use: No  . Drug use: No  . Sexual activity: Not on file   Social History   Social History Narrative  . Not on file     DATA OBTAINED & REVIEWED:  No results for input(s): HGBA1C, LABURIC, CREATINE in the last 8760 hours. X-Rays 08/20/2017: Diffuse osteopenia with diffuse mild to moderate degenerative changes.  Small effusion.  OBJECTIVE:  VS:  HT:5\' 8"  (172.7 cm)   WT:194 lb 9.6 oz (88.3 kg)  BMI:29.6    BP:(!) 120/92  HR:95bpm  TEMP: ( )  RESP:97 %   PHYSICAL EXAM: CONSTITUTIONAL: Well-developed, Well-nourished and In no acute distress PSYCHIATRIC: Alert & appropriately interactive. and Not depressed or anxious appearing. RESPIRATORY: No increased work of breathing  and Trachea Midline EYES: Pupils are equal., EOM intact without nystagmus. and No scleral icterus.  VASCULAR EXAM: Warm and well perfused NEURO: unremarkable  MSK Exam: Right knee  Well aligned, no significant deformity. No overlying skin changes. Generalized medial and lateral joint line pain.  Positive patellar grind.   RANGE OF MOTION & STRENGTH  Normal, non-painful Flexion and extension.  He is weak with hip abduction bilaterally.     SPECIALITY TESTING:  Small persistent right knee effusion but minimal    Ligamentously stable     ASSESSMENT   1. Hip flexor tightness, unspecified laterality   2. Weakness of both hips   3. Effusion of right knee   4. Recurrent pain of right knee   5. Acute pain of right knee     PLAN:  Pertinent additional documentation may be included in corresponding procedure notes, imaging studies, problem based documentation and patient instructions.  Procedures:  . None  Medications:  No orders of the defined types were placed in this encounter.  Discussion/Instructions: No problem-specific Assessment & Plan notes found for this encounter.  . Continue with the Pennsaid intermittently as needed. . Discussed red flag symptoms that warrant earlier emergent evaluation and patient voices understanding. . Activity modifications and the importance of avoiding exacerbating activities (limiting pain to no more than a 4 / 10 during or following activity) recommended and discussed.  Follow-up:  . Return if symptoms worsen or fail to improve.  . If any lack of improvement consider: . further diagnostic evaluation with MRI of the knee versus . repeat corticosteroid injections     CMA/ATC served as scribe during this visit. History, Physical, and Plan performed by medical provider. Documentation and orders reviewed and attested to.      Andrena MewsMichael D Nyajah Hyson, DO    [facility]

## 2017-10-15 NOTE — Progress Notes (Signed)
PROCEDURE NOTE: THERAPEUTIC EXERCISES (97110) 15 minutes spent for Therapeutic exercises as below and as referenced in the AVS.  This included exercises focusing on stretching, strengthening, with significant focus on eccentric aspects.   Proper technique shown and discussed handout in great detail with ATC.  All questions were discussed and answered.   Long term goals include an improvement in range of motion, strength, endurance as well as avoiding reinjury. Frequency of visits is one time as determined during today's  office visit. Frequency of exercises to be performed is as per handout.  EXERCISES REVIEWED:  Goodman Exercises  Hip ABduction strengthening with focus on Glute Medius Recruitment 

## 2017-10-15 NOTE — Patient Instructions (Signed)
Please perform the exercise program that we have prepared for you and gone over in detail on a daily basis.  In addition to the handout you were provided you can access your program through: [email or web address]   Your unique program code is:    Also check out "Foundation Training" which is a program developed by Dr. Eric Goodman.   There are links to a couple of his YouTube Videos below and I would like to see performing one of his videos 5-6 days per week.    A good intro video is: "Independence from Pain 7-minute Video" - https://www.youtube.com/watch?[email or web address]   Exercises that focus more on the neck are as below: Dr. Goodman with [employer] teaching neck and shoulder details Part 1 - https://youtu.be/[email or web address] Part 2 Dr. Goodman with [employer] quick routine to practice daily - https://youtu.be/[email or web address]  Do not try to attempt the entire video when first beginning.    Try breaking of each exercise that he goes into shorter segments.  Otherwise if they perform an exercise for 45 seconds, start with 15 seconds and rest and then resume when they begin the new activity.  If you work your way up to being able to do these videos without having to stop, I expect you will see significant improvements in your pain.  If you enjoy his videos and would like to find out more you can look on his website: [email or web address].  He has a workout streaming option as well as a DVD set available for purchase.  Amazon has the best price for his DVDs.     

## 2017-11-19 ENCOUNTER — Encounter (HOSPITAL_COMMUNITY): Payer: Self-pay | Admitting: Anesthesiology

## 2017-11-26 ENCOUNTER — Encounter: Payer: Self-pay | Admitting: Sports Medicine

## 2018-06-14 IMAGING — DX DG KNEE AP/LAT W/ SUNRISE*R*
3 series · 3 of 3 positions shown · non-contrast
Comparison: No recent prior.

CLINICAL DATA: Pain right knee.

EXAM:
RIGHT KNEE 3 VIEWS

[knee standing ap]
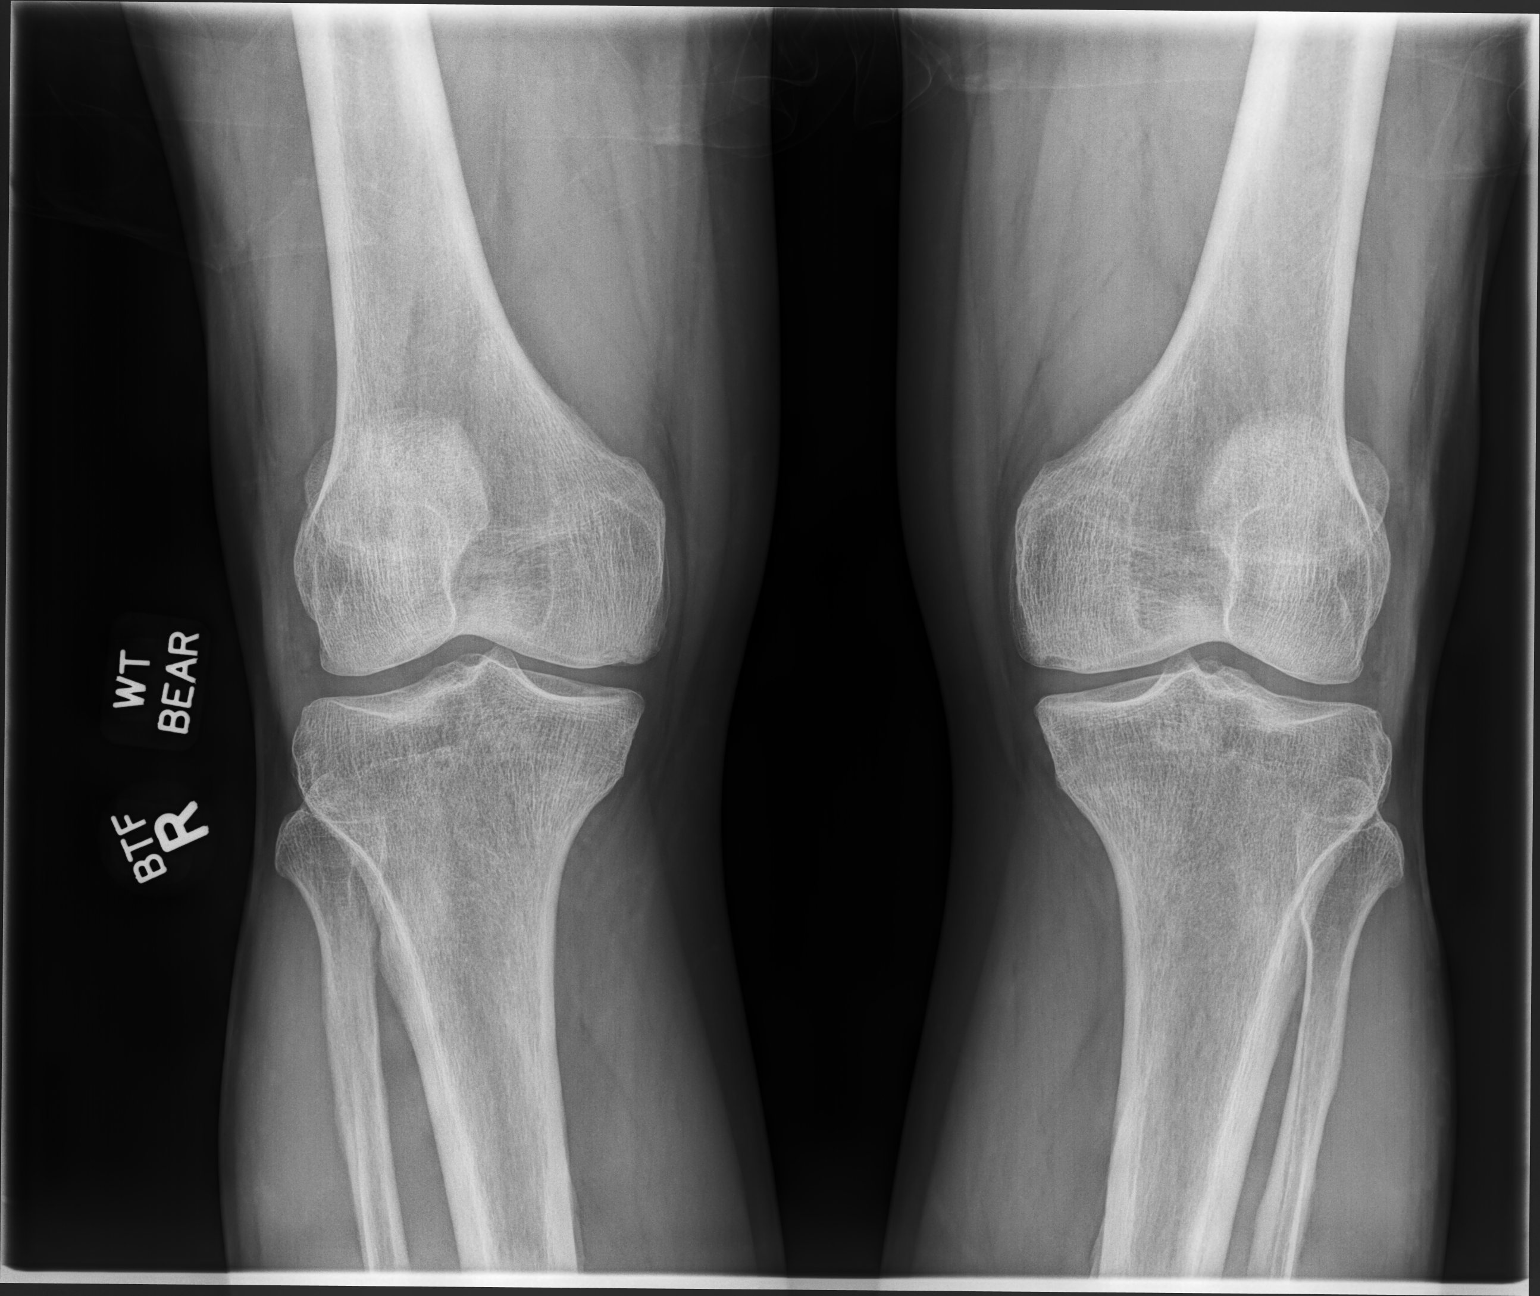

[knee standing lat]
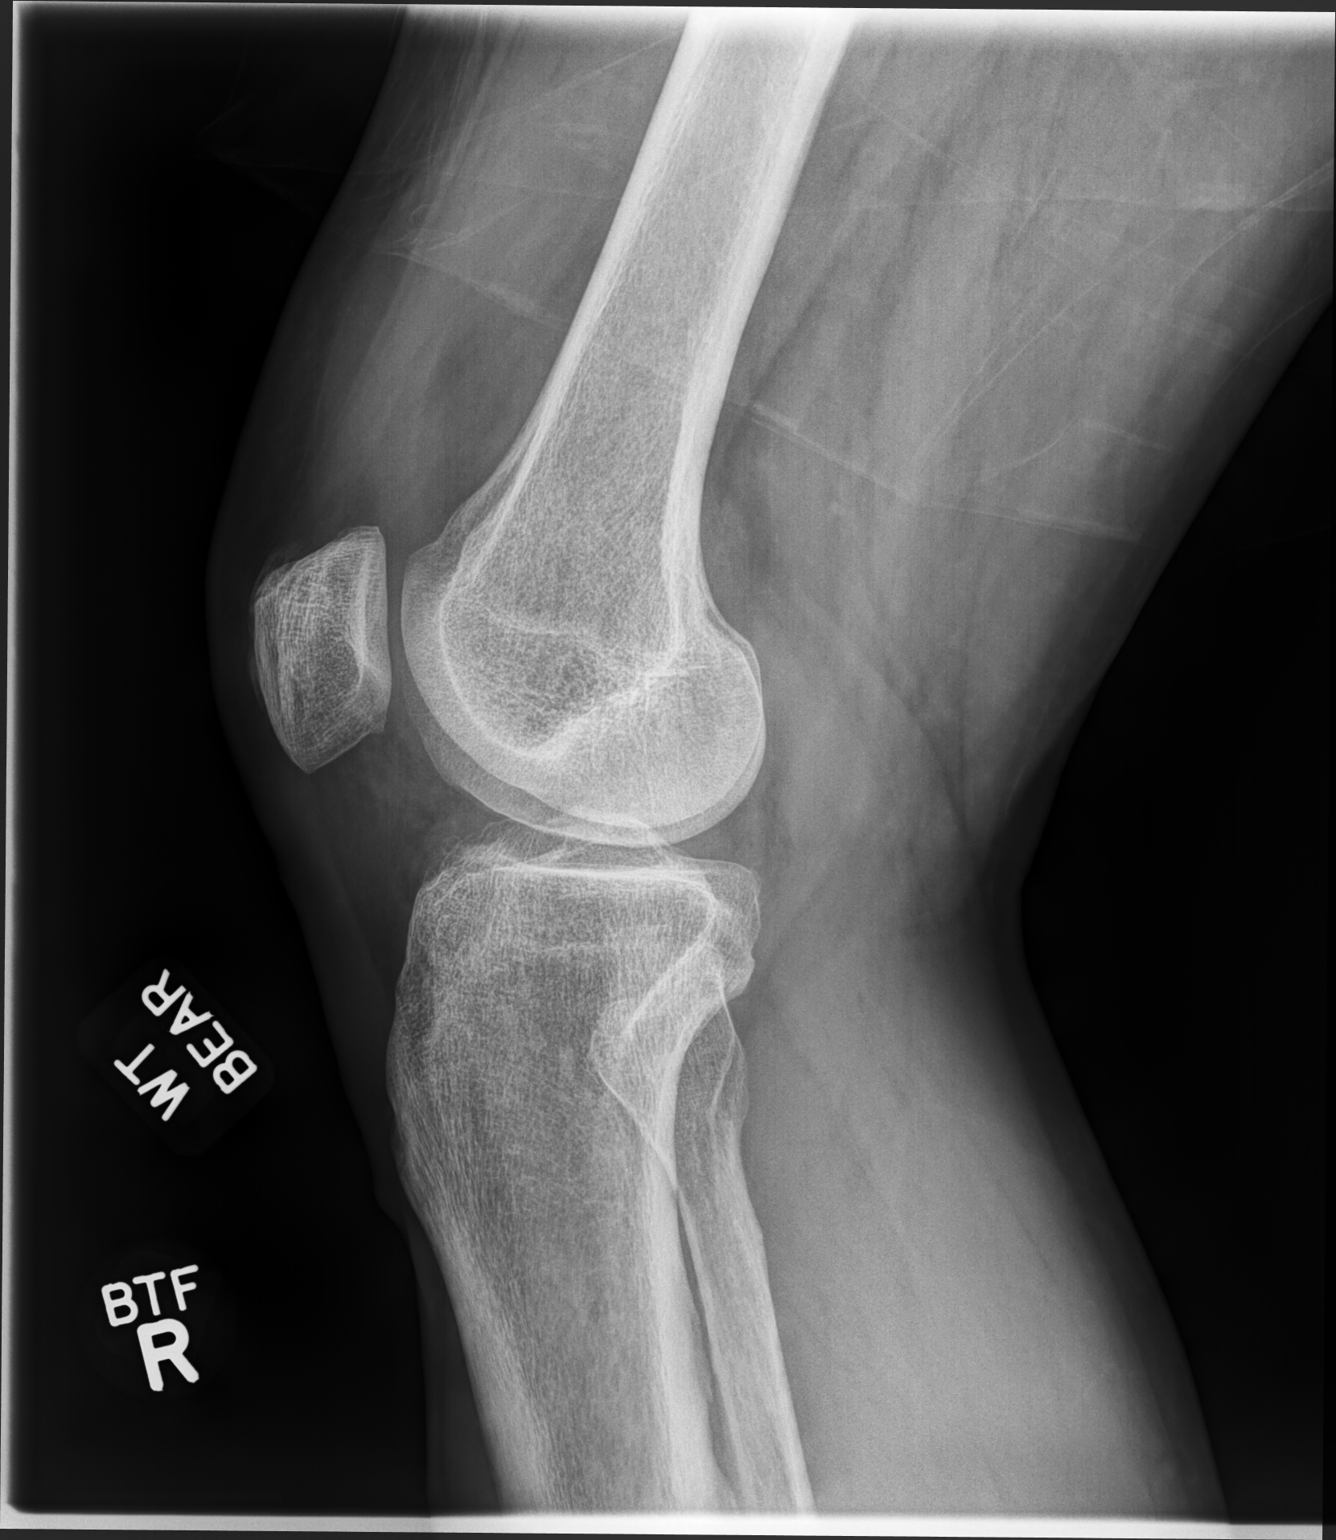

[sunrise]
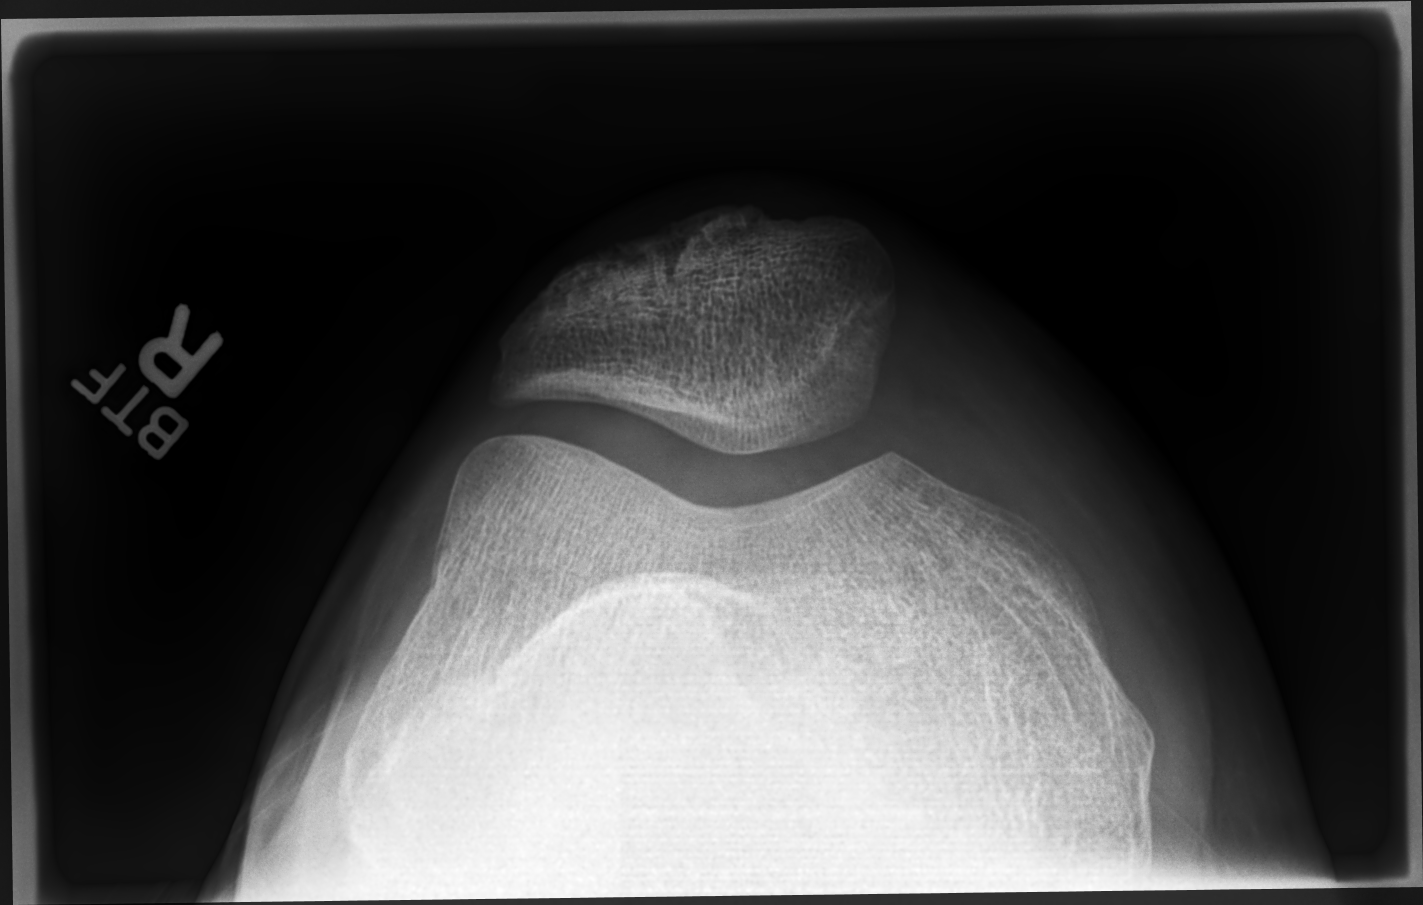

[3 of 3 positions shown; findings below may reference images not displayed]

FINDINGS: Diffuse osteopenia degenerative change.No acute bony or joint
abnormality. No evidence of fracture or dislocation. Knee joint
effusion.
IMPRESSION: 1.  Diffuse osteopenia and degenerative change.

2.  No acute bony abnormality.  Small knee joint effusion.

## 2019-03-16 ENCOUNTER — Telehealth: Payer: Self-pay | Admitting: Family Medicine

## 2019-03-16 NOTE — Telephone Encounter (Signed)
Spoke to pt and he says he tested positive for COVID but is feeling much better. He was advised to let his PCP know he has tested positive. So this is an FYI call and he would like DR G to be his PCP. Advised pt if he starts to feel bad he would be able to have a virtual visit as we are not seeing COVID pt's in office and pt voiced understanding. Advised after he is well he would need to schedule a visit with to establish care with Dr Reece Agar and pt voiced understanding. Is it ok to make you his PCP in the chart?

## 2019-03-16 NOTE — Telephone Encounter (Signed)
PCP changed

## 2019-03-16 NOTE — Telephone Encounter (Signed)
Yes that's fine 

## 2019-03-30 ENCOUNTER — Ambulatory Visit (INDEPENDENT_AMBULATORY_CARE_PROVIDER_SITE_OTHER): Payer: 59 | Admitting: Family Medicine

## 2019-03-30 ENCOUNTER — Ambulatory Visit (INDEPENDENT_AMBULATORY_CARE_PROVIDER_SITE_OTHER): Payer: 59 | Admitting: Medical

## 2019-03-30 VITALS — BP 160/98 | HR 82 | Temp 98.8°F | Wt 196.2 lb

## 2019-03-30 DIAGNOSIS — U071 COVID-19: Secondary | ICD-10-CM | POA: Diagnosis not present

## 2019-03-30 DIAGNOSIS — R05 Cough: Secondary | ICD-10-CM | POA: Diagnosis not present

## 2019-03-30 DIAGNOSIS — R0781 Pleurodynia: Secondary | ICD-10-CM | POA: Diagnosis not present

## 2019-03-30 DIAGNOSIS — M546 Pain in thoracic spine: Secondary | ICD-10-CM | POA: Diagnosis not present

## 2019-03-30 DIAGNOSIS — R0789 Other chest pain: Secondary | ICD-10-CM | POA: Diagnosis not present

## 2019-03-30 DIAGNOSIS — Z8616 Personal history of COVID-19: Secondary | ICD-10-CM | POA: Insufficient documentation

## 2019-03-30 DIAGNOSIS — R059 Cough, unspecified: Secondary | ICD-10-CM

## 2019-03-30 DIAGNOSIS — R079 Chest pain, unspecified: Secondary | ICD-10-CM

## 2019-03-30 DIAGNOSIS — I10 Essential (primary) hypertension: Secondary | ICD-10-CM | POA: Diagnosis not present

## 2019-03-30 MED ORDER — PREDNISONE 20 MG PO TABS: ORAL_TABLET | ORAL | 0 refills | Status: DC

## 2019-03-30 MED ORDER — BENZONATATE 200 MG PO CAPS
200.0000 mg | ORAL_CAPSULE | Freq: Three times a day (TID) | ORAL | 0 refills | Status: DC | PRN
Start: 2019-03-30 — End: 2019-04-27

## 2019-03-30 MED ORDER — METOPROLOL TARTRATE 25 MG PO TABS: 25.0000 mg | ORAL_TABLET | Freq: Two times a day (BID) | ORAL | 1 refills | Status: DC

## 2019-03-30 NOTE — Progress Notes (Signed)
Telephone visit  Subjective: CC: shoulder pain/ COVID PCP: Joseph Ip, Joseph Merritt Joseph Merritt is a 54 y.o. male calls for telephone consult today. Patient provides verbal consent for consult held via phone.  Due to COVID-19 pandemic this visit was conducted virtually. This visit type was conducted due to national recommendations for restrictions regarding the COVID-19 Pandemic (e.g. social distancing, sheltering in place) in an effort to limit this patient's exposure and mitigate transmission in our community. All issues noted in this document were discussed and addressed.  A physical exam was not performed with this format.   Location of patient: home Location of provider: Working remotely from home Others present for call: none  1. Shoulder pain Tested positive 03/12/2019 for COVID.  He notes shoulder pain between his shoulder blades that onset since diagnosis.  He works 3 days per week.  He is back to work.  He occasionally has a tingling under the arm that goes around the anterior chest on the right.  Denies cough, shortness of breath, pleuritic chest pain with breathing.  He has a tickle in his throat that results in mild cough.  He did have some sharp pain in the right chest with coughing yesterday morning.  No hemoptysis.  After he moves around at work, he feels better but pain in back is still somewhat present.    He does not have a pulse ox at home says not been keeping an eye on this.  Denies any calf swelling or lower extremity pain.  Does not report any hemoptysis or dyspnea on exertion.  ROS: Per HPI  No Known Allergies Past Medical History:  Diagnosis Date  . GERD (gastroesophageal reflux disease)   . PONV (postoperative nausea and vomiting)     Current Outpatient Medications:  .  Diclofenac Sodium (PENNSAID) 2 % SOLN, Place 1 application onto the skin 2 (two) times daily. (Patient taking differently: Place 1 application onto the skin 2 (two) times daily as needed (for  knee pain). ), Disp: 112 g, Rfl: 2 .  ibuprofen (ADVIL,MOTRIN) 200 MG tablet, Take 600 mg by mouth every 6 (six) hours as needed for headache or moderate pain. , Disp: , Rfl:  .  OVER THE COUNTER MEDICATION, Apply 1 application topically 2 (two) times daily as needed (for knee pain). Hemp Oil Topical, Disp: , Rfl:   Assessment/ Plan: 54 y.o. male   1. COVID-19 virus detected Previously diagnosed as an outside facility.  I have scheduled him with the respiratory clinic in [facility] for further evaluation.  I Joseph Merritt think that he needs a hands on eval given ongoing cough and right-sided rib and thoracic back pain.  Rule out secondary bacterial pneumonia.  Other differentials considered include costochondritis secondary to ongoing cough versus pulmonary embolism but he was not endorsing any calf swelling or other concerning symptoms or signs.  He is aware of location, date, time and telephone number of the [facility] site.  2. Cough  3. Rib pain on right side  4. Acute right-sided thoracic back pain   Start time: 9:39am End time: 9:49am  Total time spent on patient care (including telephone call/ virtual visit): 20 minutes  Joseph Rodger Hulen Skains, Joseph Merritt [facility] [phone]

## 2019-03-30 NOTE — Progress Notes (Signed)
[facility]   Subjective:  Joseph Merritt is a 54 y.o. male who presents for respiratory illness.    PCP: Janora Norlander, DO  Was seeing Dr. Laroy Apple prior as well  He had a virtual consult with PCP today.    He tested + for covid 03/12/19.  Started with slight pain between shoulder blades.  Had mild sore throat around 03/07/2019.   Had some chills a few times.  Had some bad headaches, had fever up to 102 and lower.   Never loss smell or taste.  Had mild cough.   No nausea or vomiting.  No diarrhea.   Did get short of breath since covid+.  Has been fatigued.   Since the diagnosis, he had been quarantining in his bedroom.   initially felt like the pain in right chest was due to lying around a lot .  For a few weeks now, been up and about.  But pain in between shoulder blades continues.  Has had a right sided chest pain, shoulder pain and right upper back chest since first week of January.   At times feels tingling.   Seems to be a crampy pain.   Pain is worse in the morning, but eases up as he is active.  Went back to work 03/23/2019.  He continues to have right chest pain.   still having SOB at times.     No left sided chest pain.   No paresthesias in hands.   Has hx/o right shoulder issues/rotator cuff problems prior, but this feels different.  No recent fall or trauma.  He hasn't particularly bene having shoulder problems of late.    Has hx/o HTN about 2.5 years ago.  Was on BP medication/fluid pill at one point 2.5 years ago.  Had physical at [facility] urgent care 11/2018.   BP was elevated there, but they didn't start medication.  Had labs, but never followed up.  He is a lifetime nonsmoker.     No other aggravating or relieving factors.  No other c/o.  Past Medical History:  Diagnosis Date  . GERD (gastroesophageal reflux disease)   . PONV (postoperative nausea and vomiting)    No current outpatient medications on file prior to visit.   No current  facility-administered medications on file prior to visit.    ROS as in subjective   Objective: BP (!) 160/98   Pulse 82   Temp 98.8 F (37.1 C)   Wt 196 lb 3.2 oz (89 kg)   SpO2 96%   BMI 29.83 kg/m   Wt Readings from Last 3 Encounters:  03/30/19 196 lb 3.2 oz (89 kg)  10/15/17 194 lb 9.6 oz (88.3 kg)  10/10/17 194 lb (88 kg)   BP Readings from Last 3 Encounters:  03/30/19 (!) 160/98  10/15/17 (!) 120/92  10/10/17 100/74    General appearence: alert, no distress, WD/WN, white male HEENT: normocephalic, sclerae anicteric, TMs pearly, nares patent, no discharge or erythema, pharynx normal Oral cavity: MMM, no lesions Neck: supple, no lymphadenopathy, no thyromegaly, no masses, no JVD Heart: RRR, normal S1, S2, no murmurs Lungs: CTA bilaterally, no wheezes, rhonchi, or rales Chest normal I:E, nontender Upper back with mild tenderness in rhomboid region, but not reproducing his level of pain Abdomen: +bs, soft, non tender, non distended, no masses, no hepatomegaly, no splenomegaly Pulses: 2+ symmetric, upper and lower extremities, normal cap refill Ext: no edema, no calve tenderness or asymmetry, neg homans Neuro: CN2-12  intact, nonfocal exam       Assessment  Encounter Diagnoses  Name Primary?  . Chest pain, unspecified type Yes  . Chest wall pain   . Essential hypertension, benign   . History of COVID-19       Plan: discussed his symptoms, concerns.  He should no longer be contagious.    We were unable to perform EKG due to technical problems with EKG machine this evening.   There is no urgent worrisome findings or symptoms   He does have uncontrolled hypertension.  I reviewed labs he had done in 11/2018 in care everywhere.  I reviewed his virtual consult from his PCP today  I suspect his chest wall pains are related to muscle and chest wall inflammation as well as lung inflammation/post covid.   I do not suspect PE.    We discussed the following  recommendations, medications, medications risks/benefits, and follow up.   He will go for chest xray tomorrow.   I advised he f/u with PCP next week and have EKG as well.    Recommendations:  Go for chest xray at [facility] in [place] or [facility] in [place], [place]  Begin tessalon Perles cough drops up to 3 times daily for cough  Begin Prednisone steroid taper for cough, chest pain, inflammation of chest wall  Begin Metoprolol blood pressure medication twice daily  Make a follow up appointment with your primary care provider soon for recheck and EKG  Gradually resume your activity as covid can wear you down.     Armoni was seen today for back pain.  Diagnoses and all orders for this visit:  Chest pain, unspecified type -     Cancel: EKG 12-Lead -     DG Chest 2 View; Future  Chest wall pain -     DG Chest 2 View; Future  Essential hypertension, benign -     DG Chest 2 View; Future  History of COVID-19  Other orders -     predniSONE (DELTASONE) 20 MG tablet; 3 tablets daily for 3 days, then 2 tablets daily for 3 days, then 1 tablet daily for 3 days, then 1/2 tablet daily for 3 days. -     benzonatate (TESSALON) 200 MG capsule; Take 1 capsule (200 mg total) by mouth 3 (three) times daily as needed for cough. -     metoprolol tartrate (LOPRESSOR) 25 MG tablet; Take 1 tablet (25 mg total) by mouth 2 (two) times daily.

## 2019-03-30 NOTE — Patient Instructions (Addendum)
You have been scheduled for 5:30pm  appointment today at the respiratory clinic on [address] in [place]. Again that address is [address]  Their telephone number is [phone]

## 2019-03-30 NOTE — Patient Instructions (Signed)
Encounter Diagnoses  Name Primary?  . Chest pain, unspecified type Yes  . Chest wall pain   . Essential hypertension, benign   . History of COVID-19    Recommendations:  Go for chest xray at [facility] in [place] or [facility] in [place], [place]    Begin tessalon Perles cough drops up to 3 times daily for cough  Begin Prednisone steroid taper for cough, chest pain, inflammation of chest wall  Begin Metoprolol blood pressure medication twice daily  Make a follow up appointment with your primary care provider soon for recheck and EKG  Gradually resume your activity as covid can wear you down.     Hypertension (high blood pressure):  Hypertension, commonly called high blood pressure, is when the force of blood pumping through your arteries is too strong. Your arteries are the blood vessels that carry blood from your heart throughout your body. A blood pressure reading consists of a higher number over a lower number, such as 110/72. The higher number (systolic) is the pressure inside your arteries when your heart pumps. The lower number (diastolic) is the pressure inside your arteries when your heart relaxes. Ideally you want your blood pressure below 120/80. Hypertension forces your heart to work harder to pump blood. Your arteries may become narrow or stiff. Having hypertension puts you at risk for heart disease, stroke, and other problems.  RISK FACTORS Some risk factors for high blood pressure are controllable. Others are not.  Risk factors you cannot control include:   Race. You may be at higher risk if you are African American.  Age. Risk increases with age.  Gender. Men are at higher risk than women before age 67 years. After age 65, women are at higher risk than men. Risk factors you can control include:  Not getting enough exercise or physical activity.  Being overweight.  Getting too much fat, sugar, calories, or salt in your diet.  Drinking too much alcohol. SIGNS  AND SYMPTOMS Hypertension does not usually cause signs or symptoms. Extremely high blood pressure (hypertensive crisis) may cause headache, anxiety, shortness of breath, and nosebleed. DIAGNOSIS  To check if you have hypertension, your health care provider will measure your blood pressure while you are seated, with your arm held at the level of your heart. It should be measured at least twice using the same arm. Certain conditions can cause a difference in blood pressure between your right and left arms. A blood pressure reading that is higher than normal on one occasion does not mean that you need treatment. If one blood pressure reading is high, ask your health care provider about having it checked again. BLOOD PRESSURE STAGES Blood pressure is classified into four stages: normal, prehypertension, stage 1, and stage 2. Your blood pressure reading will be used to determine what type of treatment, if any, is necessary. Appropriate treatment options are tied to these four stages:  Normal  Systolic pressure (mm Hg): below 120.  Diastolic pressure (mm Hg): below 80. Prehypertension  Systolic pressure (mm Hg): 120 to 139.  Diastolic pressure (mm Hg): 80 to 89. Stage1  Systolic pressure (mm Hg): 140 to 159.  Diastolic pressure (mm Hg): 90 to 99. Stage2  Systolic pressure (mm Hg): 160 or above.  Diastolic pressure (mm Hg): 100 or above. RISKS RELATED TO HIGH BLOOD PRESSURE Managing your blood pressure is an important responsibility. Uncontrolled high blood pressure can lead to:  A heart attack.  A stroke.  A weakened blood vessel (aneurysm).  Heart failure.  Kidney damage.  Eye damage.  Metabolic syndrome.  Memory and concentration problems. TREATMENT  Treating high blood pressure includes making lifestyle changes and possibly taking medicine. Living a healthy lifestyle can help lower high blood pressure. You may need to change some of your habits. Lifestyle changes may  include:  Following the DASH diet. This diet is high in fruits, vegetables, and whole grains. It is low in salt, red meat, and added sugars.  Getting at least 2 hours of brisk physical activity every week.  Losing weight if necessary.  Not smoking.  Limiting alcoholic beverages.  Learning ways to reduce stress. If lifestyle changes are not enough to get your blood pressure under control, your health care provider may prescribe medicine. You may need to take more than one. Work closely with your health care provider to understand the risks and benefits. HOME CARE INSTRUCTIONS  Have your blood pressure rechecked as directed by your health care provider.   Take medicines only as directed by your health care provider. Follow the directions carefully. Blood pressure medicines must be taken as prescribed. The medicine does not work as well when you skip doses. Skipping doses also puts you at risk for problems.   Do not smoke.   Monitor your blood pressure at home as directed by your health care provider. SEEK MEDICAL CARE IF:   You think you are having a reaction to medicines taken.  You have recurrent headaches or feel dizzy.  You have swelling in your ankles.  You have trouble with your vision. SEEK IMMEDIATE MEDICAL CARE IF:  You develop a severe headache or confusion.  You have unusual weakness, numbness, or feel faint.  You have severe chest or abdominal pain.  You vomit repeatedly.  You have trouble breathing. MAKE SURE YOU:   Understand these instructions.  Will watch your condition.  Will get help right away if you are not doing well or get worse. Document Released: 02/26/2005 Document Revised: 07/13/2013 Document Reviewed: 12/19/2012 [facility] Patient Information 2015 [place], [place]. This information is not intended to replace advice given to you by your health care provider. Make sure you discuss any questions you have with your health care  provider.

## 2019-03-31 ENCOUNTER — Telehealth: Payer: Self-pay | Admitting: Family Medicine

## 2019-03-31 NOTE — Telephone Encounter (Signed)
Advised pt to go to [facility] or [facility] in [place] for CXR per MD notes at [facility] yesterday and it said in the notes to f/u with PCP for EKG-scheduled with PCP 04/27/19 at 2:30.

## 2019-04-27 ENCOUNTER — Other Ambulatory Visit: Payer: Self-pay

## 2019-04-27 ENCOUNTER — Ambulatory Visit: Payer: 59 | Admitting: Family Medicine

## 2019-04-27 ENCOUNTER — Encounter: Payer: Self-pay | Admitting: Family Medicine

## 2019-04-27 ENCOUNTER — Ambulatory Visit (INDEPENDENT_AMBULATORY_CARE_PROVIDER_SITE_OTHER): Payer: 59 | Admitting: Family Medicine

## 2019-04-27 VITALS — BP 131/91 | HR 71 | Temp 98.0°F | Ht 68.0 in | Wt 198.2 lb

## 2019-04-27 DIAGNOSIS — R05 Cough: Secondary | ICD-10-CM | POA: Diagnosis not present

## 2019-04-27 DIAGNOSIS — R0781 Pleurodynia: Secondary | ICD-10-CM | POA: Diagnosis not present

## 2019-04-27 DIAGNOSIS — I10 Essential (primary) hypertension: Secondary | ICD-10-CM | POA: Diagnosis not present

## 2019-04-27 DIAGNOSIS — R079 Chest pain, unspecified: Secondary | ICD-10-CM

## 2019-04-27 DIAGNOSIS — R059 Cough, unspecified: Secondary | ICD-10-CM

## 2019-04-27 MED ORDER — PREDNISONE 20 MG PO TABS: ORAL_TABLET | ORAL | 0 refills | Status: DC

## 2019-04-27 MED ORDER — METOPROLOL TARTRATE 25 MG PO TABS: 25.0000 mg | ORAL_TABLET | Freq: Two times a day (BID) | ORAL | 5 refills | Status: DC

## 2019-04-27 NOTE — Progress Notes (Signed)
Subjective:  Patient ID: Joseph Merritt, male    DOB: 01-Nov-1965  Age: 54 y.o. MRN: [id number]  CC: Follow-up (EKG)   HPI Joseph Merritt presents for follow-up on his chest pain.  He was seen in the respiratory clinic for Covid 3 weeks ago.  He was noted to have chest wall pain.  However he was told to follow-up for reevaluation and EKG at this time.  He continues to have some moderate pain at the right axilla.  There is some in the posterior right shoulder at the rhomboideus region as well.  This is moderate in severity.  There is intermittent but frequent.  It is a dull ache primarily.  There is no dyspnea.  He has had no fever chills sweats or cough recently.  Depression screen [facility] 2/9 04/27/2019 06/14/2017 06/11/2016  Decreased Interest 0 0 0  Down, Depressed, Hopeless 0 0 0  PHQ - 2 Score 0 0 0    History Jaymeson has a past medical history of GERD (gastroesophageal reflux disease) and PONV (postoperative nausea and vomiting).   He has a past surgical history that includes Hand tendon surgery (Right, 2007); Biceps tendon repair (Left, 03/15/2017); and Colonoscopy (N/A, 10/10/2017).   His family history includes Diabetes in his mother; Hypertension in his father and mother.He reports that he has never smoked. He has never used smokeless tobacco. He reports that he does not drink alcohol or use drugs.    ROS Review of Systems  Constitutional: Negative for fever.  Respiratory: Negative for shortness of breath.   Cardiovascular: Negative for chest pain.  Musculoskeletal: Negative for arthralgias.  Skin: Negative for rash.    Objective:  BP (!) 131/91   Pulse 71   Temp 98 F (36.7 C) (Temporal)   Ht 5\' 8"  (1.727 m)   Wt 198 lb 3.2 oz (89.9 kg)   BMI 30.14 kg/m   BP Readings from Last 3 Encounters:  04/27/19 (!) 131/91  03/30/19 (!) 160/98  10/15/17 (!) 120/92    Wt Readings from Last 3 Encounters:  04/27/19 198 lb 3.2 oz (89.9 kg)  03/30/19 196 lb 3.2 oz (89 kg)  10/15/17 194  lb 9.6 oz (88.3 kg)     Physical Exam Vitals reviewed.  Constitutional:      Appearance: He is well-developed.  HENT:     Head: Normocephalic and atraumatic.     Right Ear: External ear normal.     Left Ear: External ear normal.     Mouth/Throat:     Pharynx: No oropharyngeal exudate or posterior oropharyngeal erythema.  Eyes:     Pupils: Pupils are equal, round, and reactive to light.  Cardiovascular:     Rate and Rhythm: Normal rate and regular rhythm.     Heart sounds: No murmur.  Pulmonary:     Effort: No respiratory distress.     Breath sounds: Normal breath sounds.  Musculoskeletal:     Cervical back: Normal range of motion and neck supple.  Neurological:     Mental Status: He is alert and oriented to person, place, and time.       Assessment & Plan:   Carrol was seen today for follow-up.  Diagnoses and all orders for this visit:  Rib pain on right side -     EKG 12-Lead  Cough -     EKG 12-Lead  Chest pain, unspecified type -     EKG 12-Lead  Essential hypertension, benign  Other orders -  predniSONE (DELTASONE) 20 MG tablet; 3 tablets daily for 3 days, then 2 tablets daily for 3 days, then 1 tablet daily for 3 days, then 1/2 tablet daily for 3 days. -     metoprolol tartrate (LOPRESSOR) 25 MG tablet; Take 1 tablet (25 mg total) by mouth 2 (two) times daily.       I have discontinued Maurine Simmering. Kliethermes's benzonatate. I am also having him maintain his predniSONE and metoprolol tartrate.  Allergies as of 04/27/2019   No Known Allergies     Medication List       Accurate as of April 27, 2019  6:35 PM. If you have any questions, ask your nurse or doctor.        STOP taking these medications   benzonatate 200 MG capsule Commonly known as: TESSALON Stopped by: Mechele Claude, MD     TAKE these medications   metoprolol tartrate 25 MG tablet Commonly known as: LOPRESSOR Take 1 tablet (25 mg total) by mouth 2 (two) times daily.   predniSONE  20 MG tablet Commonly known as: DELTASONE 3 tablets daily for 3 days, then 2 tablets daily for 3 days, then 1 tablet daily for 3 days, then 1/2 tablet daily for 3 days.      Patient's chest pain was diagnosed as chest wall pain and appears to be that still.  However that is significant and it is a sequela of his Covid infection.  Exam today does not show any signs of Covid pneumonia or other complications besides musculoskeletal pain.  He had an EKG done today which shows normal sinus rhythm without ischemic changes.  I reassured him that there is no reason to believe there is a angina pattern to this chest pain it simply does not fit the historical picture nor are there any EKG changes.  Thus the patient was reassured.  He should follow-up for his routine care in 6 months or if he develops any precordial exertional chest pain.  Follow-up: No follow-ups on file.  Mechele Claude, M.D.

## 2019-10-26 ENCOUNTER — Ambulatory Visit: Payer: 59 | Admitting: Family Medicine

## 2019-12-15 ENCOUNTER — Encounter: Payer: 59 | Admitting: Family Medicine

## 2019-12-16 ENCOUNTER — Encounter: Payer: Self-pay | Admitting: Family Medicine

## 2019-12-25 ENCOUNTER — Other Ambulatory Visit: Payer: Self-pay | Admitting: Family Medicine

## 2019-12-28 MED ORDER — METOPROLOL TARTRATE 25 MG PO TABS: 25.0000 mg | ORAL_TABLET | Freq: Two times a day (BID) | ORAL | 0 refills | Status: DC

## 2019-12-28 NOTE — Addendum Note (Signed)
Addended by: Julious Payer D on: 12/28/2019 09:39 AM   Modules accepted: Orders

## 2019-12-28 NOTE — Telephone Encounter (Signed)
E-prescribe down. resent 

## 2020-01-28 ENCOUNTER — Other Ambulatory Visit: Payer: Self-pay | Admitting: Family Medicine

## 2020-01-28 NOTE — Telephone Encounter (Signed)
[place]. NTBS 30 days given 12/28/19

## 2020-04-07 ENCOUNTER — Other Ambulatory Visit: Payer: Self-pay | Admitting: Family Medicine

## 2023-09-25 ENCOUNTER — Ambulatory Visit: Admitting: Nurse Practitioner

## 2023-10-03 ENCOUNTER — Ambulatory Visit: Admitting: Family Medicine

## 2023-10-03 VITALS — BP 127/79 | HR 81 | Temp 98.2°F | Ht 68.0 in | Wt 204.6 lb

## 2023-10-03 DIAGNOSIS — M62838 Other muscle spasm: Secondary | ICD-10-CM | POA: Diagnosis not present

## 2023-10-03 DIAGNOSIS — Z0000 Encounter for general adult medical examination without abnormal findings: Secondary | ICD-10-CM

## 2023-10-03 DIAGNOSIS — R1013 Epigastric pain: Secondary | ICD-10-CM

## 2023-10-03 DIAGNOSIS — M25561 Pain in right knee: Secondary | ICD-10-CM

## 2023-10-03 DIAGNOSIS — H6691 Otitis media, unspecified, right ear: Secondary | ICD-10-CM | POA: Diagnosis not present

## 2023-10-03 DIAGNOSIS — Z23 Encounter for immunization: Secondary | ICD-10-CM | POA: Diagnosis not present

## 2023-10-03 DIAGNOSIS — Z0001 Encounter for general adult medical examination with abnormal findings: Secondary | ICD-10-CM | POA: Diagnosis not present

## 2023-10-03 DIAGNOSIS — Z87898 Personal history of other specified conditions: Secondary | ICD-10-CM

## 2023-10-03 DIAGNOSIS — Z114 Encounter for screening for human immunodeficiency virus [HIV]: Secondary | ICD-10-CM

## 2023-10-03 DIAGNOSIS — Z13220 Encounter for screening for lipoid disorders: Secondary | ICD-10-CM

## 2023-10-03 DIAGNOSIS — M25562 Pain in left knee: Secondary | ICD-10-CM

## 2023-10-03 DIAGNOSIS — Z1159 Encounter for screening for other viral diseases: Secondary | ICD-10-CM

## 2023-10-03 DIAGNOSIS — G8929 Other chronic pain: Secondary | ICD-10-CM

## 2023-10-03 DIAGNOSIS — Z130 Encounter for screening for diseases of the blood and blood-forming organs and certain disorders involving the immune mechanism: Secondary | ICD-10-CM

## 2023-10-03 MED ORDER — AMOXICILLIN-POT CLAVULANATE 875-125 MG PO TABS: 1.0000 | ORAL_TABLET | Freq: Two times a day (BID) | ORAL | 0 refills | Status: AC

## 2023-10-03 NOTE — Progress Notes (Signed)
 Complete physical exam  Patient: Joseph Merritt   DOB: 16-Aug-1965   58 y.o. Male  MRN: [id number]  Subjective:    Chief Complaint  Patient presents with   Medical Management of Chronic Issues    Joseph Merritt is a 58 y.o. male who presents today for a complete physical exam. He reports consuming a regular diet. The patient has a physically strenuous job, but has no regular exercise apart from work.  He generally feels fairly well. He reports sleeping fairly well. He does have additional problems to discuss today.   He does have some pain on his left mid back- he can pinpoint the area. Achy pain. Doesn't radiate. Some tingling right at the area. Pain has been improving. No injury. Does lifting/pulling at work. No weakness, saddle anesthesia, change in bowel or bladder control.   He was having epigastric abdominal pain a few months ago. Dull pain. Lasted for a few weeks, now resolved. No heartburn, nausea, vomiting, jaundice, weight loss. Pain improved with eating. Resolved without no remedies.   History of chronic bilateral knee pain. Intermittent. Has had xrays before without significant findings. He previously had a joint injection in his left knee with provided good relief. He has not had an injection in the right knee before but is interested in doing do. This knee sometimes bothers him for days at a time.   Reports hx of prediabetes.   Denies right ear pain. Has had daily nasal congestion.  Most recent fall risk assessment:    04/27/2019    1:43 PM  Fall Risk   Falls in the past year? 0   Number falls in past yr: 0  Injury with Fall? 0  Risk for fall due to : No Fall Risks     Data saved with a previous flowsheet row definition     Most recent depression screenings:    04/27/2019    1:43 PM 06/14/2017    1:03 PM  PHQ 2/9 Scores  PHQ - 2 Score 0 0    Vision:Not within last year  and Dental: No current dental problems and Receives regular dental care  Past Medical History:   Diagnosis Date   GERD (gastroesophageal reflux disease)    PONV (postoperative nausea and vomiting)       Patient Care Team: Joesph Annabella HERO, FNP as PCP - General (Family Medicine) Harvey Margo CROME, MD (Inactive) as Consulting Physician (Gastroenterology)   Outpatient Medications Prior to Visit  Medication Sig   [DISCONTINUED] metoprolol  tartrate (LOPRESSOR ) 25 MG tablet Take 1 tablet (25 mg total) by mouth 2 (two) times daily. (Needs to be seen before next refill)   [DISCONTINUED] predniSONE  (DELTASONE ) 20 MG tablet 3 tablets daily for 3 days, then 2 tablets daily for 3 days, then 1 tablet daily for 3 days, then 1/2 tablet daily for 3 days.   No facility-administered medications prior to visit.    ROS Negative unless specially indicated above in HPI.    Objective:     BP 127/79   Pulse 81   Temp 98.2 F (36.8 C) (Temporal)   Ht 5' 8 (1.727 m)   Wt 204 lb 9.6 oz (92.8 kg)   SpO2 95%   BMI 31.11 kg/m    Physical Exam Vitals and nursing note reviewed.  Constitutional:      General: He is not in acute distress.    Appearance: He is not ill-appearing, toxic-appearing or diaphoretic.  HENT:     Head:  Normocephalic.     Right Ear: Ear canal and external ear normal. A middle ear effusion is present. Tympanic membrane is erythematous. Tympanic membrane is not perforated, retracted or bulging.     Left Ear: Tympanic membrane, ear canal and external ear normal.     Nose: Nose normal.     Mouth/Throat:     Mouth: Mucous membranes are moist.     Pharynx: Oropharynx is clear.  Eyes:     Extraocular Movements: Extraocular movements intact.     Conjunctiva/sclera: Conjunctivae normal.     Pupils: Pupils are equal, round, and reactive to light.  Cardiovascular:     Rate and Rhythm: Normal rate and regular rhythm.     Pulses: Normal pulses.     Heart sounds: Normal heart sounds. No murmur heard.    No friction rub. No gallop.  Pulmonary:     Effort: Pulmonary effort is  normal.     Breath sounds: Normal breath sounds.  Abdominal:     General: Bowel sounds are normal. There is no distension.     Palpations: Abdomen is soft. There is no mass.     Tenderness: There is no abdominal tenderness. There is no right CVA tenderness, left CVA tenderness, guarding or rebound.  Musculoskeletal:        General: No swelling. Normal range of motion.     Cervical back: Normal range of motion and neck supple. No tenderness.     Right lower leg: No edema.     Left lower leg: No edema.  Lymphadenopathy:     Cervical: No cervical adenopathy.  Skin:    General: Skin is warm and dry.     Capillary Refill: Capillary refill takes less than 2 seconds.     Findings: No lesion or rash.  Neurological:     General: No focal deficit present.     Mental Status: He is alert and oriented to person, place, and time.  Psychiatric:        Mood and Affect: Mood normal.        Behavior: Behavior normal.        Thought Content: Thought content normal.      No results found for any visits on 10/03/23.     Assessment & Plan:    Routine Health Maintenance and Physical Exam  Joseph Merritt was seen today for medical management of chronic issues.  Diagnoses and all orders for this visit:  Well adult exam  Encounter for screening for lipid disorder He will return for fasting labs.  -     Lipid panel; Future  Screening for endocrine, metabolic and immunity disorder -     CBC with Differential/Platelet; Future -     CMP14+EGFR; Future -     TSH; Future  History of prediabetes -     Bayer DCA Hb A1c Waived; Future  Need for hepatitis C screening test -     Hepatitis C antibody; Future  Encounter for screening for HIV -     HIV Antibody (routine testing w rflx); Future  Need for vaccination -     Varicella-zoster vaccine IM  Acute right otitis media Augmentin  as below.  -     amoxicillin -clavulanate (AUGMENTIN ) 875-125 MG tablet; Take 1 tablet by mouth 2 (two) times daily for 7  days.  Muscle spasm Tylenol prn for pain. Discussed muscle relaxer if pain persists.   Epigastric pain Now resolved. ? GERD.   Chronic knee pain Can return for right knee  injection.    Immunization History  Administered Date(s) Administered   Influenza,inj,Quad PF,6+ Mos 02/18/2013, 12/27/2014   Td 07/11/2011   Zoster Recombinant(Shingrix ) 10/03/2023    Health Maintenance  Topic Date Due   HIV Screening  Never done   Hepatitis C Screening  Never done   DTaP/Tdap/Td (2 - Tdap) 10/02/2024 (Originally 07/10/2021)   Hepatitis B Vaccines (1 of 3 - 19+ 3-dose series) 10/02/2024 (Originally 08/02/1985)   COVID-19 Vaccine (1 - 2024-25 season) 10/18/2024 (Originally 11/11/2022)   INFLUENZA VACCINE  10/11/2023   Zoster Vaccines- Shingrix  (2 of 2) 11/28/2023   Colonoscopy  10/11/2027   HPV VACCINES  Aged Out   Meningococcal B Vaccine  Aged Out    Discussed health benefits of physical activity, and encouraged him to engage in regular exercise appropriate for his age and condition.  Problem List Items Addressed This Visit       Other   Chronic pain of both knees   Other Visit Diagnoses       Well adult exam    -  Primary     Encounter for screening for lipid disorder       Relevant Orders   Lipid panel     Screening for endocrine, metabolic and immunity disorder       Relevant Orders   CBC with Differential/Platelet   CMP14+EGFR   TSH     History of prediabetes       Relevant Orders   Bayer DCA Hb A1c Waived     Need for hepatitis C screening test       Relevant Orders   Hepatitis C antibody     Encounter for screening for HIV       Relevant Orders   HIV Antibody (routine testing w rflx)     Need for vaccination       Relevant Orders   Varicella-zoster vaccine IM (Completed)     Acute right otitis media       Relevant Medications   amoxicillin -clavulanate (AUGMENTIN ) 875-125 MG tablet     Muscle spasm         Epigastric pain          Return for schedule knee  injection anytime- 30 mins.   The patient indicates understanding of these issues and agrees with the plan.  Annabella CHRISTELLA Search, FNP

## 2023-10-11 ENCOUNTER — Other Ambulatory Visit

## 2023-10-11 DIAGNOSIS — Z1159 Encounter for screening for other viral diseases: Secondary | ICD-10-CM

## 2023-10-11 DIAGNOSIS — Z130 Encounter for screening for diseases of the blood and blood-forming organs and certain disorders involving the immune mechanism: Secondary | ICD-10-CM

## 2023-10-11 DIAGNOSIS — Z114 Encounter for screening for human immunodeficiency virus [HIV]: Secondary | ICD-10-CM

## 2023-10-11 DIAGNOSIS — Z87898 Personal history of other specified conditions: Secondary | ICD-10-CM

## 2023-10-11 DIAGNOSIS — Z13220 Encounter for screening for lipoid disorders: Secondary | ICD-10-CM

## 2023-10-11 LAB — BAYER DCA HB A1C WAIVED: HB A1C (BAYER DCA - WAIVED): 5.6 % (ref 4.8–5.6)

## 2023-10-11 LAB — LIPID PANEL

## 2023-10-12 LAB — CBC WITH DIFFERENTIAL/PLATELET
Basophils Absolute: 0.1 x10E3/uL (ref 0.0–0.2)
Basos: 1 %
EOS (ABSOLUTE): 0.7 x10E3/uL — ABNORMAL HIGH (ref 0.0–0.4)
Eos: 8 %
Hematocrit: 49 % (ref 37.5–51.0)
Hemoglobin: 15.4 g/dL (ref 13.0–17.7)
Immature Grans (Abs): 0.1 x10E3/uL (ref 0.0–0.1)
Immature Granulocytes: 1 %
Lymphocytes Absolute: 2.4 x10E3/uL (ref 0.7–3.1)
Lymphs: 30 %
MCH: 28.4 pg (ref 26.6–33.0)
MCHC: 31.4 g/dL — ABNORMAL LOW (ref 31.5–35.7)
MCV: 90 fL (ref 79–97)
Monocytes Absolute: 0.6 x10E3/uL (ref 0.1–0.9)
Monocytes: 8 %
Neutrophils Absolute: 4.3 x10E3/uL (ref 1.4–7.0)
Neutrophils: 52 %
Platelets: 406 x10E3/uL (ref 150–450)
RBC: 5.42 x10E6/uL (ref 4.14–5.80)
RDW: 14 % (ref 11.6–15.4)
WBC: 8.2 x10E3/uL (ref 3.4–10.8)

## 2023-10-12 LAB — CMP14+EGFR
ALT: 23 IU/L (ref 0–44)
AST: 26 IU/L (ref 0–40)
Albumin: 4 g/dL (ref 3.8–4.9)
Alkaline Phosphatase: 93 IU/L (ref 44–121)
BUN/Creatinine Ratio: 12 (ref 9–20)
BUN: 12 mg/dL (ref 6–24)
Bilirubin Total: <0.2 mg/dL (ref 0.0–1.2)
CO2: 22 mmol/L (ref 20–29)
Calcium: 9 mg/dL (ref 8.7–10.2)
Chloride: 104 mmol/L (ref 96–106)
Creatinine, Ser: 1 mg/dL (ref 0.76–1.27)
Globulin, Total: 2.5 g/dL (ref 1.5–4.5)
Glucose: 97 mg/dL (ref 70–99)
Potassium: 4.6 mmol/L (ref 3.5–5.2)
Sodium: 140 mmol/L (ref 134–144)
Total Protein: 6.5 g/dL (ref 6.0–8.5)
eGFR: 88 mL/min/1.73 (ref >59)

## 2023-10-12 LAB — HEPATITIS C ANTIBODY: Hep C Virus Ab: NONREACTIVE

## 2023-10-12 LAB — LIPID PANEL
Chol/HDL Ratio: 3.8 ratio (ref 0.0–5.0)
Cholesterol, Total: 143 mg/dL (ref 100–199)
HDL: 38 mg/dL — ABNORMAL LOW (ref >39)
LDL Chol Calc (NIH): 93 mg/dL (ref 0–99)
Triglycerides: 59 mg/dL (ref 0–149)
VLDL Cholesterol Cal: 12 mg/dL (ref 5–40)

## 2023-10-12 LAB — HIV ANTIBODY (ROUTINE TESTING W REFLEX): HIV Screen 4th Generation wRfx: NONREACTIVE

## 2023-10-12 LAB — TSH: TSH: 1.22 u[IU]/mL (ref 0.450–4.500)

## 2023-10-14 ENCOUNTER — Ambulatory Visit: Payer: Self-pay | Admitting: Family Medicine

## 2023-10-17 ENCOUNTER — Ambulatory Visit: Admitting: Family Medicine

## 2023-10-30 ENCOUNTER — Ambulatory Visit (INDEPENDENT_AMBULATORY_CARE_PROVIDER_SITE_OTHER): Admitting: Family Medicine

## 2023-10-30 ENCOUNTER — Encounter: Payer: Self-pay | Admitting: Family Medicine

## 2023-10-30 VITALS — BP 118/73 | HR 86 | Temp 97.8°F | Ht 68.0 in | Wt 208.0 lb

## 2023-10-30 DIAGNOSIS — G8929 Other chronic pain: Secondary | ICD-10-CM

## 2023-10-30 DIAGNOSIS — M79671 Pain in right foot: Secondary | ICD-10-CM | POA: Diagnosis not present

## 2023-10-30 DIAGNOSIS — M25561 Pain in right knee: Secondary | ICD-10-CM | POA: Diagnosis not present

## 2023-10-30 NOTE — Progress Notes (Signed)
   Acute Office Visit  Subjective:     Patient ID: Joseph Merritt, male    DOB: Jan 18, 1966, 58 y.o.   MRN: [id number]  Chief Complaint  Patient presents with   KNEE INJECTION    RIGHT    HPI Patient is in today for chronic right knee pain. Pain occurs most daily. He has tried conservative measures like tylenol, NSAIDs, bracing, rest with minimal improvement. He has seen ortho in the past and had unremarkable Xrays. He has had an injection in the left knee in the past that provided good relief. Here for injection in right knee today.   Also report sharp pain in bottom on right heel for about 2 months. No pain in ankle, no injury. Pain gets worse with more activity. Denies numbness, tingling.    ROS As per HPI.      Objective:    BP 118/73   Pulse 86   Temp 97.8 F (36.6 C)   Ht 5' 8 (1.727 m)   Wt 208 lb (94.3 kg)   SpO2 95%   BMI 31.63 kg/m    Physical Exam Vitals and nursing note reviewed.  Musculoskeletal:     Right knee: No swelling, deformity, effusion, erythema, ecchymosis, lacerations or bony tenderness. Normal alignment.     Right lower leg: No edema.     Left lower leg: No edema.     Right ankle: Normal.  Neurological:     General: No focal deficit present.     Mental Status: He is oriented to person, place, and time.  Psychiatric:        Mood and Affect: Mood normal.        Behavior: Behavior normal.    Joint Injection/Arthrocentesis  Date/Time: 10/30/2023 10:18 AM  Performed by: Joesph Annabella HERO, FNP Authorized by: Joesph Annabella HERO, FNP  Indications: pain  Body area: knee Joint: right knee Local anesthesia used: yes  Anesthesia: Local anesthesia used: yes Local Anesthetic: topical anesthetic  Sedation: Patient sedated: no  Preparation: Patient was prepped and draped in the usual sterile fashion. Needle size: 22 G Ultrasound guidance: no Approach: anterior Betamethasone amount: 60 mg Lidocaine 2% amount: 2 mL Patient tolerance: patient  tolerated the procedure well with no immediate complications     No results found for any visits on 10/30/23.      Assessment & Plan:   Izen was seen today for knee injection.  Diagnoses and all orders for this visit:  Chronic pain of right knee Tolerated procedure well with no immediate complications. -     Joint Injection/Arthrocentesis  Pain of right heel Discussed supportive shoes, heel inserts. Discussed xray if no improvement with conservative measures.   Return to office for new or worsening symptoms, or if symptoms persist.   The patient indicates understanding of these issues and agrees with the plan.  Annabella HERO Joesph, FNP

## 2023-11-13 MED ORDER — METHYLPREDNISOLONE ACETATE 40 MG/ML IJ SUSP
60.0000 mg | Freq: Once | INTRAMUSCULAR | Status: AC
  Administered 2023-10-30: 60 mg via INTRA_ARTICULAR

## 2023-11-13 NOTE — Addendum Note (Signed)
 Addended by: Emani Taussig G on: 11/13/2023 12:39 PM   Modules accepted: Orders

## 2024-04-14 ENCOUNTER — Ambulatory Visit

## 2024-04-22 ENCOUNTER — Ambulatory Visit
